# Patient Record
Sex: Female | Born: 1980 | Race: White | Hispanic: No | Marital: Married | Smoking: Never smoker
Health system: Southern US, Community
[De-identification: ages and names within clinical notes are randomized; demographics above are authoritative.]

## PROBLEM LIST (undated history)

## (undated) DIAGNOSIS — R55 Syncope and collapse: Secondary | ICD-10-CM

## (undated) DIAGNOSIS — E559 Vitamin D deficiency, unspecified: Secondary | ICD-10-CM

## (undated) DIAGNOSIS — R002 Palpitations: Secondary | ICD-10-CM

## (undated) DIAGNOSIS — J45909 Unspecified asthma, uncomplicated: Secondary | ICD-10-CM

## (undated) DIAGNOSIS — E7800 Pure hypercholesterolemia, unspecified: Secondary | ICD-10-CM

## (undated) DIAGNOSIS — O139 Gestational [pregnancy-induced] hypertension without significant proteinuria, unspecified trimester: Secondary | ICD-10-CM

## (undated) DIAGNOSIS — R42 Dizziness and giddiness: Secondary | ICD-10-CM

## (undated) HISTORY — DX: Dizziness and giddiness: R42

## (undated) HISTORY — DX: Unspecified asthma, uncomplicated: J45.909

## (undated) HISTORY — DX: Pure hypercholesterolemia, unspecified: E78.00

## (undated) HISTORY — DX: Vitamin D deficiency, unspecified: E55.9

## (undated) HISTORY — DX: Palpitations: R00.2

## (undated) HISTORY — PX: WISDOM TOOTH EXTRACTION: SHX21

## (undated) HISTORY — PX: OTHER SURGICAL HISTORY: SHX169

## (undated) HISTORY — DX: Syncope and collapse: R55

---

## 2000-04-18 ENCOUNTER — Other Ambulatory Visit: Admission: RE | Admit: 2000-04-18 | Discharge: 2000-04-18 | Payer: Self-pay | Admitting: Obstetrics & Gynecology

## 2001-02-20 ENCOUNTER — Encounter: Admission: RE | Admit: 2001-02-20 | Discharge: 2001-02-20 | Payer: Self-pay | Admitting: Occupational Medicine

## 2001-02-20 ENCOUNTER — Encounter: Payer: Self-pay | Admitting: Occupational Medicine

## 2004-04-07 ENCOUNTER — Other Ambulatory Visit: Admission: RE | Admit: 2004-04-07 | Discharge: 2004-04-07 | Payer: Self-pay | Admitting: Obstetrics and Gynecology

## 2004-07-21 ENCOUNTER — Ambulatory Visit (HOSPITAL_COMMUNITY): Admission: RE | Admit: 2004-07-21 | Discharge: 2004-07-21 | Payer: Self-pay | Admitting: Obstetrics & Gynecology

## 2004-12-08 ENCOUNTER — Inpatient Hospital Stay (HOSPITAL_COMMUNITY): Admission: AD | Admit: 2004-12-08 | Discharge: 2004-12-11 | Payer: Self-pay | Admitting: Obstetrics and Gynecology

## 2006-11-16 ENCOUNTER — Inpatient Hospital Stay (HOSPITAL_COMMUNITY): Admission: AD | Admit: 2006-11-16 | Discharge: 2006-11-18 | Payer: Self-pay | Admitting: Obstetrics and Gynecology

## 2009-04-01 ENCOUNTER — Emergency Department: Payer: Self-pay | Admitting: Unknown Physician Specialty

## 2009-04-08 ENCOUNTER — Ambulatory Visit: Payer: Self-pay | Admitting: Orthopedic Surgery

## 2009-04-13 ENCOUNTER — Emergency Department: Payer: Self-pay | Admitting: Unknown Physician Specialty

## 2010-03-31 ENCOUNTER — Inpatient Hospital Stay (HOSPITAL_COMMUNITY): Admission: RE | Admit: 2010-03-31 | Discharge: 2010-04-02 | Payer: Self-pay | Admitting: Obstetrics and Gynecology

## 2010-06-16 ENCOUNTER — Ambulatory Visit: Payer: Self-pay | Admitting: Family Medicine

## 2010-06-16 DIAGNOSIS — IMO0002 Reserved for concepts with insufficient information to code with codable children: Secondary | ICD-10-CM | POA: Insufficient documentation

## 2010-06-16 DIAGNOSIS — I10 Essential (primary) hypertension: Secondary | ICD-10-CM

## 2010-06-28 ENCOUNTER — Telehealth (INDEPENDENT_AMBULATORY_CARE_PROVIDER_SITE_OTHER): Payer: Self-pay | Admitting: *Deleted

## 2010-07-01 ENCOUNTER — Ambulatory Visit: Payer: Self-pay | Admitting: Family Medicine

## 2010-07-01 DIAGNOSIS — Z8639 Personal history of other endocrine, nutritional and metabolic disease: Secondary | ICD-10-CM

## 2010-07-01 DIAGNOSIS — Z862 Personal history of diseases of the blood and blood-forming organs and certain disorders involving the immune mechanism: Secondary | ICD-10-CM | POA: Insufficient documentation

## 2010-07-04 LAB — CONVERTED CEMR LAB: GGT: 31 units/L (ref 7–51)

## 2010-07-05 LAB — CONVERTED CEMR LAB
HCV Ab: NEGATIVE
Hep A IgM: NEGATIVE
Hep B C IgM: NEGATIVE
Hepatitis B Surface Ag: NEGATIVE

## 2010-07-06 ENCOUNTER — Encounter: Admission: RE | Admit: 2010-07-06 | Discharge: 2010-07-06 | Payer: Self-pay | Admitting: Family Medicine

## 2010-07-06 ENCOUNTER — Telehealth: Payer: Self-pay | Admitting: Family Medicine

## 2010-07-06 LAB — CONVERTED CEMR LAB
ALT: 70 units/L — ABNORMAL HIGH (ref 0–35)
AST: 42 units/L — ABNORMAL HIGH (ref 0–37)
Albumin: 4.2 g/dL (ref 3.5–5.2)
Alkaline Phosphatase: 103 units/L (ref 39–117)
Bilirubin, Direct: 0.2 mg/dL (ref 0.0–0.3)
Total Bilirubin: 1.1 mg/dL (ref 0.3–1.2)
Total Protein: 7.5 g/dL (ref 6.0–8.3)

## 2010-07-21 ENCOUNTER — Ambulatory Visit: Payer: Self-pay | Admitting: Family Medicine

## 2010-07-22 LAB — CONVERTED CEMR LAB
ALT: 50 units/L — ABNORMAL HIGH (ref 0–35)
AST: 36 units/L (ref 0–37)
Albumin: 4.5 g/dL (ref 3.5–5.2)
Alkaline Phosphatase: 103 units/L (ref 39–117)
Bilirubin, Direct: 0.2 mg/dL (ref 0.0–0.3)
Total Bilirubin: 1.5 mg/dL — ABNORMAL HIGH (ref 0.3–1.2)
Total Protein: 7.5 g/dL (ref 6.0–8.3)

## 2011-01-01 LAB — CONVERTED CEMR LAB
ALT: 69 units/L — ABNORMAL HIGH (ref 0–35)
AST: 43 units/L — ABNORMAL HIGH (ref 0–37)
Albumin: 4.4 g/dL (ref 3.5–5.2)
Alkaline Phosphatase: 90 units/L (ref 39–117)
BUN: 15 mg/dL (ref 6–23)
Basophils Absolute: 0 10*3/uL (ref 0.0–0.1)
Basophils Relative: 0.3 % (ref 0.0–3.0)
Bilirubin, Direct: 0.1 mg/dL (ref 0.0–0.3)
CO2: 28 meq/L (ref 19–32)
Calcium: 9.6 mg/dL (ref 8.4–10.5)
Chloride: 106 meq/L (ref 96–112)
Cholesterol: 244 mg/dL — ABNORMAL HIGH (ref 0–200)
Creatinine, Ser: 0.6 mg/dL (ref 0.4–1.2)
Direct LDL: 163.7 mg/dL
Eosinophils Absolute: 0.2 10*3/uL (ref 0.0–0.7)
Eosinophils Relative: 2.5 % (ref 0.0–5.0)
GFR calc non Af Amer: 126.26 mL/min (ref >60)
Glucose, Bld: 78 mg/dL (ref 70–99)
HCT: 38.5 % (ref 36.0–46.0)
HDL: 69.1 mg/dL (ref >39.00)
Hemoglobin: 13.2 g/dL (ref 12.0–15.0)
Lymphocytes Relative: 31 % (ref 12.0–46.0)
Lymphs Abs: 2.1 10*3/uL (ref 0.7–4.0)
MCHC: 34.4 g/dL (ref 30.0–36.0)
MCV: 85.7 fL (ref 78.0–100.0)
Monocytes Absolute: 0.4 10*3/uL (ref 0.1–1.0)
Monocytes Relative: 5.6 % (ref 3.0–12.0)
Neutro Abs: 4.1 10*3/uL (ref 1.4–7.7)
Neutrophils Relative %: 60.6 % (ref 43.0–77.0)
Platelets: 283 10*3/uL (ref 150.0–400.0)
Potassium: 4.3 meq/L (ref 3.5–5.1)
RBC: 4.49 M/uL (ref 3.87–5.11)
RDW: 13.8 % (ref 11.5–14.6)
Sodium: 140 meq/L (ref 135–145)
TSH: 0.58 microintl units/mL (ref 0.35–5.50)
Total Bilirubin: 1 mg/dL (ref 0.3–1.2)
Total CHOL/HDL Ratio: 4
Total Protein: 7.8 g/dL (ref 6.0–8.3)
Triglycerides: 93 mg/dL (ref 0.0–149.0)
VLDL: 18.6 mg/dL (ref 0.0–40.0)
WBC: 6.8 10*3/uL (ref 4.5–10.5)

## 2011-01-03 NOTE — Assessment & Plan Note (Signed)
Summary: new to estab/cbs   Vital Signs:  Patient profile:   30 year old female Height:      64 inches Weight:      149 pounds BMI:     25.67 Temp:     98.6 degrees F oral Pulse rate:   64 / minute BP sitting:   130 / 100  (left arm)  Vitals Entered By: Jeremy Johann CMA (June 16, 2010 9:39 AM) CC: NEW TO ESTABLISH, DISCUSS ELEVATED BP   History of Present Illness: Pt is here to establish and to have BP checked.  Pt with hx elevated BP during all 3 pregnancies.   Pt was on labetalol after her son -- she was not d/c with any med after her daughter was born 3 months ago.  Pt with no symptoms--no ha, no cp, no sob.    Preventive Screening-Counseling & Management  Alcohol-Tobacco     Alcohol drinks/day: 0     Smoking Status: never  Caffeine-Diet-Exercise     Caffeine use/day: 2-3     Does Patient Exercise: no     Exercise Counseling: to improve exercise regimen  Current Medications (verified): 1)  Prenatal Vitamins .... Once Daily  Allergies (verified): 1)  ! Pcn  Past History:  Past Medical History: Current Problems:  HYPERTENSION, GESTATIONAL (ICD-642.90)  Past Surgical History: R arm fracture  Family History: Reviewed history and no changes required. DM-MATERNAL GRANDPARENTS HTN-MOTHER,FATHER,BROTHER,GRANDEPARENTS BREAST CA-PATERNAL GM  Social History: Reviewed history and no changes required. Married Never Smoked Alcohol use-no Regular exercise-no Occupation:  [employer] Smoking Status:  never Does Patient Exercise:  no Caffeine use/day:  2-3 Occupation:  employed  Review of Systems      See HPI  Physical Exam  General:  Well-developed,well-nourished,in no acute distress; alert,appropriate and cooperative throughout examination Neck:  No deformities, masses, or tenderness noted. Lungs:  Normal respiratory effort, chest expands symmetrically. Lungs are clear to auscultation, no crackles or wheezes. Heart:  normal rate and no murmur.     Extremities:  No clubbing, cyanosis, edema, or deformity noted with normal full range of motion of all joints.   Psych:  Cognition and judgment appear intact. Alert and cooperative with normal attention span and concentration. No apparent delusions, illusions, hallucinations   Impression & Recommendations:  Problem # 1:  HYPERTENSION, BENIGN ESSENTIAL (ICD-401.1) watch salt in diet diet and exercise d/w pt Her updated medication list for this problem includes:    Labetalol Hcl 100 Mg Tabs (Labetalol hcl) .Marland Kitchen... 1 by mouth two times a day  Orders: EKG w/ Interpretation (93000) Venipuncture (16109) TLB-Lipid Panel (80061-LIPID) TLB-BMP (Basic Metabolic Panel-BMET) (80048-METABOL) TLB-CBC Platelet - w/Differential (85025-CBCD) TLB-Hepatic/Liver Function Pnl (80076-HEPATIC) TLB-TSH (Thyroid Stimulating Hormone) (84443-TSH)  BP today: 130/100  Complete Medication List: 1)  Prenatal Vitamins  .... Once daily 2)  Labetalol Hcl 100 Mg Tabs (Labetalol hcl) .Marland Kitchen.. 1 by mouth two times a day 3)  Bp Monitor  .... As directed  Patient Instructions: 1)  Please schedule a follow-up appointment in 2 weeks.  2)  Check your  Blood Pressure regularly .  Prescriptions: BP MONITOR as directed  #1 x 0   Entered and Authorized by:   Loreen Freud DO   Signed by:   Loreen Freud DO on 06/16/2010   Method used:   Print then Give to Patient   RxID:   [id number] LABETALOL HCL 100 MG TABS (LABETALOL HCL) 1 by mouth two times a day  #60 x 2   Entered  and Authorized by:   Loreen Freud DO   Signed by:   Loreen Freud DO on 06/16/2010   Method used:   Electronically to        [facility] [address] (retail)       [address]       [place], [place]  [address]       Ph: [phone]       Fax: [phone]   RxID:   [id number]    EKG  Procedure date:  06/16/2010  Findings:      Normal sinus rhythm with rate of:  70

## 2011-01-03 NOTE — Assessment & Plan Note (Signed)
Summary: rto 2 weeks/cbs   Vital Signs:  Patient profile:   30 year old female Weight:      146 pounds Pulse rate:   80 / minute BP sitting:   140 / 90  (right arm)  Vitals Entered By: Almeta Monas CMA Duncan Dull) (July 21, 2010 1:01 PM) CC: 2 wk f/u, pt has not taken BP meds today   History of Present Illness:  Hypertension follow-up      This is a 30 year old woman who presents for Hypertension follow-up.  The patient denies lightheadedness, urinary frequency, headaches, edema, impotence, rash, and fatigue.  The patient denies the following associated symptoms: chest pain, chest pressure, exercise intolerance, dyspnea, palpitations, syncope, leg edema, and pedal edema.  Compliance with medications (by patient report) has been near 100%.  The patient reports that dietary compliance has been good.  Adjunctive measures currently used by the patient include salt restriction.    Current Medications (verified): 1)  Prenatal Vitamins .... Once Daily 2)  Labetalol Hcl 200 Mg Tabs (Labetalol Hcl) .Marland Kitchen.. 1 By Mouth Two Times A Day 3)  Bp Monitor .... As Directed  Allergies (verified): 1)  ! Pcn  Past History:  Past medical, surgical, family and social histories (including risk factors) reviewed for relevance to current acute and chronic problems.  Past Medical History: Reviewed history from 06/16/2010 and no changes required. Current Problems:  HYPERTENSION, GESTATIONAL (ICD-642.90)  Past Surgical History: Reviewed history from 06/16/2010 and no changes required. R arm fracture  Family History: Reviewed history from 06/16/2010 and no changes required. DM-MATERNAL GRANDPARENTS HTN-MOTHER,FATHER,BROTHER,GRANDEPARENTS BREAST CA-PATERNAL GM  Social History: Reviewed history from 06/16/2010 and no changes required. Married Never Smoked Alcohol use-no Regular exercise-no Occupation:  [employer]  Review of Systems      See HPI  Physical Exam  General:   Well-developed,well-nourished,in no acute distress; alert,appropriate and cooperative throughout examination Neck:  No deformities, masses, or tenderness noted. Lungs:  Normal respiratory effort, chest expands symmetrically. Lungs are clear to auscultation, no crackles or wheezes. Heart:  normal rate and no murmur.   Extremities:  No clubbing, cyanosis, edema, or deformity noted with normal full range of motion of all joints.   Psych:  Oriented X3 and normally interactive.     Impression & Recommendations:  Problem # 1:  HYPERTENSION, BENIGN ESSENTIAL (ICD-401.1)  Her updated medication list for this problem includes:    Labetalol Hcl 200 Mg Tabs (Labetalol hcl) .Marland Kitchen... 1 by mouth two times a day  BP today: 140/90 Prior BP: 130/106 (07/01/2010)  Labs Reviewed: K+: 4.3 (06/16/2010) Creat: : 0.6 (06/16/2010)   Chol: 244 (06/16/2010)   HDL: 69.10 (06/16/2010)   TG: 93.0 (06/16/2010)  Problem # 2:  LIVER FUNCTION TESTS, ABNORMAL, HX OF (ICD-V12.2) ? if from labetalol Orders: Venipuncture (16109) TLB-Hepatic/Liver Function Pnl (80076-HEPATIC) Specimen Handling (60454) TLB-Hepatic/Liver Function Pnl (80076-HEPATIC)  Complete Medication List: 1)  Prenatal Vitamins  .... Once daily 2)  Labetalol Hcl 200 Mg Tabs (Labetalol hcl) .Marland Kitchen.. 1 by mouth two times a day 3)  Bp Monitor  .... As directed  Patient Instructions: 1)  Please schedule a follow-up appointment in 3 months .

## 2011-01-03 NOTE — Assessment & Plan Note (Signed)
Summary: 2 week followup//kn   Vital Signs:  Patient profile:   30 year old female Height:      64 inches Weight:      148 pounds Temp:     98.5 degrees F oral Pulse rate:   86 / minute BP sitting:   130 / 106  (left arm)  Vitals Entered By: Jeremy Johann CMA (July 01, 2010 9:37 AM) CC: 2 WEEK F/U BP CHECK   History of Present Illness:  Hypertension follow-up      This is a 30 year old woman who presents for Hypertension follow-up.  The patient denies lightheadedness, urinary frequency, headaches, edema, impotence, rash, and fatigue.  The patient denies the following associated symptoms: chest pain, chest pressure, exercise intolerance, dyspnea, palpitations, syncope, leg edema, and pedal edema.  Compliance with medications (by patient report) has been near 100%.  The patient reports that dietary compliance has been good.  The patient reports no exercise.  Adjunctive measures currently used by the patient include salt restriction.    Pt also needs to reveiw labs.  Pt is not drinking ETOH or taking and otc meds.    Current Medications (verified): 1)  Prenatal Vitamins .... Once Daily 2)  Labetalol Hcl 200 Mg Tabs (Labetalol Hcl) .Marland Kitchen.. 1 By Mouth Two Times A Day 3)  Bp Monitor .... As Directed  Allergies (verified): 1)  ! Pcn  Past History:  Past medical, surgical, family and social histories (including risk factors) reviewed for relevance to current acute and chronic problems.  Past Medical History: Reviewed history from 06/16/2010 and no changes required. Current Problems:  HYPERTENSION, GESTATIONAL (ICD-642.90)  Past Surgical History: Reviewed history from 06/16/2010 and no changes required. R arm fracture  Family History: Reviewed history from 06/16/2010 and no changes required. DM-MATERNAL GRANDPARENTS HTN-MOTHER,FATHER,BROTHER,GRANDEPARENTS BREAST CA-PATERNAL GM  Social History: Reviewed history from 06/16/2010 and no changes required. Married Never  Smoked Alcohol use-no Regular exercise-no Occupation:  [employer]  Review of Systems      See HPI  Physical Exam  General:  Well-developed,well-nourished,in no acute distress; alert,appropriate and cooperative throughout examination Neck:  No deformities, masses, or tenderness noted. Lungs:  Normal respiratory effort, chest expands symmetrically. Lungs are clear to auscultation, no crackles or wheezes. Heart:  normal rate and no murmur.   Extremities:  No clubbing, cyanosis, edema, or deformity noted with normal full range of motion of all joints.   Psych:  Cognition and judgment appear intact. Alert and cooperative with normal attention span and concentration. No apparent delusions, illusions, hallucinations   Impression & Recommendations:  Problem # 1:  HYPERTENSION, BENIGN ESSENTIAL (ICD-401.1)  Her updated medication list for this problem includes:    Labetalol Hcl 200 Mg Tabs (Labetalol hcl) .Marland Kitchen... 1 by mouth two times a day  BP today: 130/106 Prior BP: 130/100 (06/16/2010)  Labs Reviewed: K+: 4.3 (06/16/2010) Creat: : 0.6 (06/16/2010)   Chol: 244 (06/16/2010)   HDL: 69.10 (06/16/2010)   TG: 93.0 (06/16/2010)  Problem # 2:  LIVER FUNCTION TESTS, ABNORMAL, HX OF (ICD-V12.2)  Orders: Venipuncture (16109) TLB-Hepatic/Liver Function Pnl (80076-HEPATIC) T-Hepatitis Acute Panel (60454-09811) T- * Misc. Laboratory test [id number]) Specimen Handling (29562) TLB-GGT (Gamma GT) (82977-GGT)  Complete Medication List: 1)  Prenatal Vitamins  .... Once daily 2)  Labetalol Hcl 200 Mg Tabs (Labetalol hcl) .Marland Kitchen.. 1 by mouth two times a day 3)  Bp Monitor  .... As directed  Patient Instructions: 1)  Please schedule a follow-up appointment in 2 weeks.  Prescriptions:  LABETALOL HCL 200 MG TABS (LABETALOL HCL) 1 by mouth two times a day  #60 x 2   Entered and Authorized by:   Loreen Freud DO   Signed by:   Loreen Freud DO on 07/01/2010   Method used:   Print then Give to  Patient   RxID:   [id number]   Appended Document: 2 week followup//kn  Laboratory Results   Urine Tests   Date/Time Reported: July 01, 2010 10:22 AM   Routine Urinalysis   Color: yellow Appearance: Clear Glucose: negative   (Normal Range: Negative) Bilirubin: negative   (Normal Range: Negative) Ketone: negative   (Normal Range: Negative) Spec. Gravity: >=1.030   (Normal Range: 1.003-1.035) Blood: negative   (Normal Range: Negative) pH: 5.0   (Normal Range: 5.0-8.0) Protein: 30   (Normal Range: Negative) Urobilinogen: negative   (Normal Range: 0-1) Nitrite: negative   (Normal Range: Negative) Leukocyte Esterace: negative   (Normal Range: Negative)    Comments: Floydene Flock  July 01, 2010 10:22 AM

## 2011-01-03 NOTE — Progress Notes (Signed)
Summary: LAB RESULTS  Phone Note Outgoing Call   Call placed by: Jeremy Johann CMA,  June 28, 2010 9:02 AM Details for Reason: Ideally your LDL (bad cholesterol) should be <_100___, your HDL (good cholesterol) should be >__50_ and your triglycerides should be< 150.  Diet and exercise will increase HDL and decrease the LDL and triglycerides. Read Dr. Danice Goltz book--Eat Drink and Be Healthy.  Recheck 3 months--272.4  hep, lipid  Liver enzymes are elevate.  Any  alcohol, tylenol, othe othc meds. ?  Stop all and recheck 2 weekss---790.4  hep, ggt. Summary of Call: left message to call office....................Marland KitchenFelecia Deloach CMA  June 28, 2010 9:02 AM   DISCUSS WITH PATIENT, pt coming in tomorrow.......Marland KitchenFelecia Deloach CMA  June 30, 2010 11:32 AM

## 2011-01-03 NOTE — Progress Notes (Signed)
Summary: lab results  Phone Note Outgoing Call   Call placed by: [facility] CMA,  July 06, 2010 2:57 PM Details for Reason: ABDOMINAL ULTRASOUND-no acute findings only renal cyst -- Summary of Call: Hepatic/Liver Function Panel same---check [place] abd    left message to call office......................Marland KitchenFelecia Deloach CMA  July 06, 2010 2:56 PM   Follow-up for Phone Call        Patient notified.  Follow-up by: Lucious Groves CMA,  July 06, 2010 3:00 PM

## 2011-02-21 LAB — CBC
HCT: 30.8 % — ABNORMAL LOW (ref 36.0–46.0)
Hemoglobin: 13.3 g/dL (ref 12.0–15.0)
MCHC: 34.1 g/dL (ref 30.0–36.0)
MCV: 87.6 fL (ref 78.0–100.0)
RBC: 3.52 MIL/uL — ABNORMAL LOW (ref 3.87–5.11)
RBC: 4.43 MIL/uL (ref 3.87–5.11)
WBC: 10.1 10*3/uL (ref 4.0–10.5)

## 2011-02-21 LAB — COMPREHENSIVE METABOLIC PANEL
ALT: 11 U/L (ref 0–35)
AST: 21 U/L (ref 0–37)
Alkaline Phosphatase: 162 U/L — ABNORMAL HIGH (ref 39–117)
CO2: 21 mEq/L (ref 19–32)
GFR calc Af Amer: >60 mL/min (ref >60)
GFR calc non Af Amer: >60 mL/min (ref >60)
Glucose, Bld: 76 mg/dL (ref 70–99)
Potassium: 4.1 mEq/L (ref 3.5–5.1)
Sodium: 136 mEq/L (ref 135–145)
Total Protein: 6.1 g/dL (ref 6.0–8.3)

## 2011-02-21 LAB — LACTATE DEHYDROGENASE: LDH: 127 U/L (ref 94–250)

## 2011-05-01 ENCOUNTER — Emergency Department (HOSPITAL_COMMUNITY): Payer: Worker's Compensation

## 2011-05-01 ENCOUNTER — Emergency Department (HOSPITAL_COMMUNITY)
Admission: EM | Admit: 2011-05-01 | Discharge: 2011-05-01 | Disposition: A | Discharge status: Home / Self Care | Payer: Worker's Compensation | Attending: Emergency Medicine | Admitting: Emergency Medicine

## 2011-05-01 DIAGNOSIS — X500XXA Overexertion from strenuous movement or load, initial encounter: Secondary | ICD-10-CM | POA: Insufficient documentation

## 2011-05-01 DIAGNOSIS — M7730 Calcaneal spur, unspecified foot: Secondary | ICD-10-CM | POA: Insufficient documentation

## 2011-05-01 DIAGNOSIS — S93409A Sprain of unspecified ligament of unspecified ankle, initial encounter: Secondary | ICD-10-CM | POA: Insufficient documentation

## 2013-02-03 ENCOUNTER — Emergency Department (HOSPITAL_COMMUNITY)
Admission: EM | Admit: 2013-02-03 | Discharge: 2013-02-03 | Disposition: A | Discharge status: Home / Self Care | Payer: 59 | Attending: Emergency Medicine | Admitting: Emergency Medicine

## 2013-02-03 DIAGNOSIS — S61239A Puncture wound without foreign body of unspecified finger without damage to nail, initial encounter: Secondary | ICD-10-CM

## 2013-02-03 DIAGNOSIS — S61209A Unspecified open wound of unspecified finger without damage to nail, initial encounter: Secondary | ICD-10-CM | POA: Insufficient documentation

## 2013-02-03 DIAGNOSIS — W268XXA Contact with other sharp object(s), not elsewhere classified, initial encounter: Secondary | ICD-10-CM | POA: Insufficient documentation

## 2013-02-03 DIAGNOSIS — Y9389 Activity, other specified: Secondary | ICD-10-CM | POA: Insufficient documentation

## 2013-02-03 DIAGNOSIS — Y929 Unspecified place or not applicable: Secondary | ICD-10-CM | POA: Insufficient documentation

## 2013-02-03 NOTE — ED Notes (Signed)
Pt states she cut her R index finger while trying to open a package of meat. Pt has lac to tip of R index finger. Bleeding controlled. Pt states she is up to date on her Tetanus shot.

## 2013-02-03 NOTE — ED Provider Notes (Signed)
Medical screening examination/treatment/procedure(s) were performed by non-physician practitioner and as supervising physician I was immediately available for consultation/collaboration.   [name], MD 02/03/13 2051

## 2013-02-03 NOTE — ED Provider Notes (Signed)
History    This chart was scribed for non-physician practitioner working with [name], MD by Charolett Bumpers, ED Scribe. This patient was seen in room WTR5/WTR5 and the patient's care was started at 1948.   CSN: [id number]  Arrival date & time 02/03/13  1912   First MD Initiated Contact with Patient 02/03/13 1948      Chief Complaint  Patient presents with  . Extremity Laceration    The history is provided by the patient. No language interpreter was used.   Mary Yang is a 32 y.o. female who presents to the Emergency Department complaining of laceration to her right index finger that occurred while opening a package of meat PTA. The laceration is located on the right index finger tip. She states that she bled heavily for about 20 minutes and tried putting flour on it to stop the bleeding. Bleeding is controlled here in ED. She reports mild associated pain and has not taken anything for pain. She states that her Tetanus was updated a year ago.    No past medical history on file.  No past surgical history on file.  No family history on file.  History  Substance Use Topics  . Smoking status: Not on file  . Smokeless tobacco: Not on file  . Alcohol Use: Not on file    OB History   No data available      Review of Systems A complete 10 system review of systems was obtained and all systems are negative except as noted in the HPI and PMH.   Allergies  Penicillins  Home Medications  No current outpatient prescriptions on file.  BP 140/102  Pulse 94  Temp(Src) 98.2 F (36.8 C) (Oral)  SpO2 100%  Physical Exam  Nursing note and vitals reviewed. Constitutional: She is oriented to person, place, and time. She appears well-developed and well-nourished. No distress.  HENT:  Head: Normocephalic and atraumatic.  Eyes: EOM are normal.  Neck: Neck supple. No tracheal deviation present.  Cardiovascular: Normal rate.   Pulmonary/Chest: Effort normal. No  respiratory distress.  Musculoskeletal: Normal range of motion.  Neurological: She is alert and oriented to person, place, and time.  Skin: Skin is warm and dry.  Psychiatric: She has a normal mood and affect. Her behavior is normal.    ED Course  Procedures (including critical care time)  DIAGNOSTIC STUDIES: Oxygen Saturation is 100% on room air, normal by my interpretation.    COORDINATION OF CARE:  20:05-Discussed planned course of treatment with the patient including cleaning the wound to determine what repair is needed, who is agreeable at this time.    Labs Reviewed - No data to display No results found.   No diagnosis found.   1. Puncture wound, right index finger  MDM  Small, nonsuturable puncture wound to distal right index finger. Steri-strips applied to control bleeding.     I personally performed the services described in this documentation, which was scribed in my presence. The recorded information has been reviewed and is accurate.       Arnoldo Hooker, PA-C 02/03/13 2036

## 2014-03-16 ENCOUNTER — Encounter (HOSPITAL_COMMUNITY): Payer: Self-pay | Admitting: Emergency Medicine

## 2014-03-16 ENCOUNTER — Emergency Department (HOSPITAL_COMMUNITY): Payer: 59

## 2014-03-16 ENCOUNTER — Emergency Department (HOSPITAL_COMMUNITY)
Admission: EM | Admit: 2014-03-16 | Discharge: 2014-03-16 | Disposition: A | Discharge status: Home / Self Care | Payer: 59 | Attending: Emergency Medicine | Admitting: Emergency Medicine

## 2014-03-16 DIAGNOSIS — R079 Chest pain, unspecified: Secondary | ICD-10-CM

## 2014-03-16 DIAGNOSIS — R0789 Other chest pain: Secondary | ICD-10-CM | POA: Insufficient documentation

## 2014-03-16 DIAGNOSIS — R0602 Shortness of breath: Secondary | ICD-10-CM | POA: Insufficient documentation

## 2014-03-16 DIAGNOSIS — R000 Tachycardia, unspecified: Secondary | ICD-10-CM | POA: Insufficient documentation

## 2014-03-16 DIAGNOSIS — Z880 Allergy status to penicillin: Secondary | ICD-10-CM | POA: Insufficient documentation

## 2014-03-16 HISTORY — DX: Gestational (pregnancy-induced) hypertension without significant proteinuria, unspecified trimester: O13.9

## 2014-03-16 LAB — BASIC METABOLIC PANEL
BUN: 11 mg/dL (ref 6–23)
CHLORIDE: 100 meq/L (ref 96–112)
CO2: 23 mEq/L (ref 19–32)
Calcium: 9.4 mg/dL (ref 8.4–10.5)
Creatinine, Ser: 0.73 mg/dL (ref 0.50–1.10)
GFR calc non Af Amer: >90 mL/min (ref >90)
Glucose, Bld: 143 mg/dL — ABNORMAL HIGH (ref 70–99)
POTASSIUM: 4.5 meq/L (ref 3.7–5.3)
Sodium: 139 mEq/L (ref 137–147)

## 2014-03-16 LAB — CBC
HEMATOCRIT: 41.2 % (ref 36.0–46.0)
HEMOGLOBIN: 14.1 g/dL (ref 12.0–15.0)
MCH: 29.8 pg (ref 26.0–34.0)
MCHC: 34.2 g/dL (ref 30.0–36.0)
MCV: 87.1 fL (ref 78.0–100.0)
Platelets: 274 10*3/uL (ref 150–400)
RBC: 4.73 MIL/uL (ref 3.87–5.11)
RDW: 12.8 % (ref 11.5–15.5)
WBC: 10.8 10*3/uL — AB (ref 4.0–10.5)

## 2014-03-16 LAB — I-STAT TROPONIN, ED: TROPONIN I, POC: 0 ng/mL (ref 0.00–0.08)

## 2014-03-16 LAB — PRO B NATRIURETIC PEPTIDE: Pro B Natriuretic peptide (BNP): 10.7 pg/mL (ref 0–125)

## 2014-03-16 MED ORDER — IOHEXOL 350 MG/ML SOLN
80.0000 mL | Freq: Once | INTRAVENOUS | Status: AC | PRN
  Administered 2014-03-16: 60 mL via INTRAVENOUS

## 2014-03-16 MED ORDER — IBUPROFEN 600 MG PO TABS: 600.0000 mg | ORAL_TABLET | Freq: Four times a day (QID) | ORAL | Status: DC | PRN

## 2014-03-16 MED ORDER — SODIUM CHLORIDE 0.9 % IV BOLUS (SEPSIS)
1000.0000 mL | Freq: Once | INTRAVENOUS | Status: AC
  Administered 2014-03-16: 1000 mL via INTRAVENOUS

## 2014-03-16 NOTE — ED Notes (Signed)
Dr. Docherty at [facility] 

## 2014-03-16 NOTE — ED Notes (Addendum)
Pt in c/o left sided chest pain that started today around 11am, pain left and then returned, radiates to left shoulder and left arm, also with numbness and tingling that has worsened in the last 30 min, pt states she doesn't feel right, intermittent diaphoresis, fatigue, dizziness, states her hands are swollen- pt reports possible weight gain over last few days, states home scale 3-4 days ago and scale today are approx 10 lbs different.

## 2014-03-16 NOTE — Discharge Instructions (Signed)
Chest Pain (Nonspecific) °It is often hard to give a specific diagnosis for the cause of chest pain. There is always a chance that your pain could be related to something serious, such as a heart attack or a blood clot in the lungs. You need to follow up with your caregiver for further evaluation. °CAUSES  °· Heartburn. °· Pneumonia or bronchitis. °· Anxiety or stress. °· Inflammation around your heart (pericarditis) or lung (pleuritis or pleurisy). °· A blood clot in the lung. °· A collapsed lung (pneumothorax). It can develop suddenly on its own (spontaneous pneumothorax) or from injury (trauma) to the chest. °· Shingles infection (herpes zoster virus). °The chest wall is composed of bones, muscles, and cartilage. Any of these can be the source of the pain. °· The bones can be bruised by injury. °· The muscles or cartilage can be strained by coughing or overwork. °· The cartilage can be affected by inflammation and become sore (costochondritis). °DIAGNOSIS  °Lab tests or other studies, such as X-rays, electrocardiography, stress testing, or cardiac imaging, may be needed to find the cause of your pain.  °TREATMENT  °· Treatment depends on what may be causing your chest pain. Treatment may include: °· Acid blockers for heartburn. °· Anti-inflammatory medicine. °· Pain medicine for inflammatory conditions. °· Antibiotics if an infection is present. °· You may be advised to change lifestyle habits. This includes stopping smoking and avoiding alcohol, caffeine, and chocolate. °· You may be advised to keep your head raised (elevated) when sleeping. This reduces the chance of acid going backward from your stomach into your esophagus. °· Most of the time, nonspecific chest pain will improve within 2 to 3 days with rest and mild pain medicine. °HOME CARE INSTRUCTIONS  °· If antibiotics were prescribed, take your antibiotics as directed. Finish them even if you start to feel better. °· For the next few days, avoid physical  activities that bring on chest pain. Continue physical activities as directed. °· Do not smoke. °· Avoid drinking alcohol. °· Only take over-the-counter or prescription medicine for pain, discomfort, or fever as directed by your caregiver. °· Follow your caregiver's suggestions for further testing if your chest pain does not go away. °· Keep any follow-up appointments you made. If you do not go to an appointment, you could develop lasting (chronic) problems with pain. If there is any problem keeping an appointment, you must call to reschedule. °SEEK MEDICAL CARE IF:  °· You think you are having problems from the medicine you are taking. Read your medicine instructions carefully. °· Your chest pain does not go away, even after treatment. °· You develop a rash with blisters on your chest. °SEEK IMMEDIATE MEDICAL CARE IF:  °· You have increased chest pain or pain that spreads to your arm, neck, jaw, back, or abdomen. °· You develop shortness of breath, an increasing cough, or you are coughing up blood. °· You have severe back or abdominal pain, feel nauseous, or vomit. °· You develop severe weakness, fainting, or chills. °· You have a fever. °THIS IS AN EMERGENCY. Do not wait to see if the pain will go away. Get medical help at once. Call your local emergency services (911 in [place] Do not drive yourself to the hospital. °MAKE SURE YOU:  °· Understand these instructions. °· Will watch your condition. °· Will get help right away if you are not doing well or get worse. °Document Released: 08/30/2005 Document Revised: 02/12/2012 Document Reviewed: 06/25/2008 °ExitCare® Patient Information ©2014 [employer],   LLC. ° ° ° °Emergency Department Resource Guide °1) Find a Doctor and Pay Out of Pocket °Although you won't have to find out who is covered by your insurance plan, it is a good idea to ask around and get recommendations. You will then need to call the office and see if the doctor you have chosen will accept you as a new  patient and what types of options they offer for patients who are self-pay. Some doctors offer discounts or will set up payment plans for their patients who do not have insurance, but you will need to ask so you aren't surprised when you get to your appointment. ° °2) Contact Your Local Health Department °Not all health departments have doctors that can see patients for sick visits, but many do, so it is worth a call to see if yours does. If you don't know where your local health department is, you can check in your phone book. The CDC also has a tool to help you locate your state's health department, and many state websites also have listings of all of their local health departments. ° °3) Find a Walk-in Clinic °If your illness is not likely to be very severe or complicated, you may want to try a walk in clinic. These are popping up all over the country in pharmacies, drugstores, and shopping centers. They're usually staffed by nurse practitioners or physician assistants that have been trained to treat common illnesses and complaints. They're usually fairly quick and inexpensive. However, if you have serious medical issues or chronic medical problems, these are probably not your best option. ° °No Primary Care Doctor: °- Call [facility] at  832-8000 - they can help you locate a primary care doctor that  accepts your insurance, provides certain services, etc. °- Physician Referral Service- 1-800-533-3463 ° °Chronic Pain Problems: °Organization         Address  Phone   Notes  °[facility]  (336) 297-2271 Patients need to be referred by their primary care doctor.  ° °Medication Assistance: °Organization         Address  Phone   Notes  °[place] [facility] [address] °[place], [place] [address] (336) 641-8030 --Must be a resident of [place] °-- Must have NO insurance coverage whatsoever (no Medicaid/ Medicare, etc.) °-- The pt. MUST have a primary  care doctor that directs their care regularly and follows them in the community °  °[facility]  (866) 331-1348   °[employer]  (888) 892-1162   ° °Agencies that provide inexpensive medical care: °Organization         Address  Phone   Notes  °[facility]  (336) 832-8035   °[facility]    (336) 832-7272   °[facility] [address] °[place], [place] [address] (336) 832-4777   °[facility] [address], °[place] (336) 271-4999   °[facility]    (336) 373-0678   °[facility]    (336) 272-1050   °[facility] ° [address], [place] Phone:  (336) 832-4444, Fax:  (336) 832-4440 Hours of Operation:  9 am - 6 pm, M-F.  Also accepts Medicaid/Medicare and self-pay.  °[facility] ° [address], [place] Phone: (336) 832-3150, Fax: (336) [phone]. Hours of Operation:  8:30 am - 5:30 pm, M-F.  Also accepts Medicaid and self-pay.  °  [facility] [place] [address], [place] Phone: (336) 878-6027   °[facility] [address], [place], [place] (336)723-1848, Ext. 123 Mondays & Thursdays: 7-9 AM.  First 15 patients are seen on a first come, first serve basis. °  ° °Medicaid-accepting [place] Providers: ° °Organization         Address  Phone   Notes  °[facility] [address], [place] (336) 641-2100 Also accepts self-pay patients.  °[facility] [address], [place] ° (336) 856-9996   °[facility] [address], [place] (336) 288-8857   °[facility] [address] [place] [address], [place] (336) 299-7000   °Veita Bland [address], [place]  ° (336) 373-1557 Only accepts [place] Access Medicaid patients after they have their name applied to their card.  ° °Self-Pay (no insurance) in [place]: ° °Organization         Address  Phone   Notes  °Sickle Cell Patients, [facility] [address], [place] (336) 832-1970   °[facility] [address], [place] (336) 832-4400   °[facility] ° [address], [place] (336) 992-4800   °[facility]/Dr. Osei-Bonsu ° [address] [place] [address], [place] or [address], [place] (336) 841-8500 Phone number for both [place] and [place] locations is the same.  °[facility] [address], [place] (336) 299-0000   °[facility] [address] [place] [address], [place] or [address] (336) 852-7530 °(336) 878-2260   °[facility] [address], [place] (336) 350-1642, phone; (336) 294-5005, fax Sees patients 1st and 3rd Saturday of every month.  Must not qualify for public or private insurance (i.e. Medicaid, Medicare, [place] Health Choice, Veterans' Benefits) • Household income should be no more than 200% of the poverty level •The clinic cannot treat you if you are pregnant or think you are pregnant • Sexually transmitted diseases are not treated at the clinic.  ° ° °Dental Care: °Organization         Address  Phone  Notes  °[facility] [address], [place] (336) 641-6152 Accepts children up to age 21 who are enrolled in Medicaid or [place] Health Choice; pregnant women with a Medicaid card; and children who have applied for Medicaid or [place] Health Choice, but were declined, whose parents can pay a reduced fee at time of service.  °[facility] [place]  [address], [place] (336) 641-7733 Accepts children up to age 21 who are enrolled in Medicaid or  Health Choice; pregnant women with a Medicaid card; and children who have applied for Medicaid or  Health Choice, but were declined, whose parents can  pay a reduced fee at time of service.  °[facility] ° [address],  (336) 641-4533 Patients are seen by appointment only. Walk-ins are not accepted. [facility] will see patients 18 years of age and older. °Monday - Tuesday (8am-5pm) °Most Wednesdays (8:30-5pm) °$30 per visit, cash only  °[facility] ° [address], [place] (336) 641-4533 Patients are seen by appointment only. Walk-ins are not accepted. [facility] will see patients 18 years of   age and older. °One Wednesday Evening (Monthly: Volunteer Based).  $30 per visit, cash only  °[facility]  (919) 537-3737 for adults; Children under age 4, call Graduate Pediatric Dentistry at ([phone]) [phone]. Children aged 4-14, please call ([phone]) [phone] to request a pediatric application. ° Dental services are provided in all areas of dental care including fillings, crowns and bridges, complete and partial dentures, implants, gum treatment, root canals, and extractions. Preventive care is also provided. Treatment is provided to both adults and children. °Patients are selected via a lottery and there is often a waiting list. °  °[facility] [address], °[place] ° (336) 763-8833 [email or web address] °  °[facility] [address], [place], [place] (336)723-1848, Ext. 123 Second and Fourth Thursday of each month, opens at 6:30 AM; Clinic ends at 9 AM.  Patients are seen on a first-come first-served basis, and a limited number are seen during each clinic.  ° °[facility] ° [address], [place], [place] (336) 723-7904   Eligibility Requirements °You must have lived in [place], [place], or [place] counties for at least the last three months. °  You cannot be eligible for state or federal sponsored healthcare insurance, including Veterans Administration, Medicaid, or Medicare. °  You generally cannot be eligible for healthcare  insurance through your employer.  °  How to apply: °Eligibility screenings are held every Tuesday and Wednesday afternoon from 1:00 pm until 4:00 pm. You do not need an appointment for the interview!  °[facility] [address], [place], [place] 336-631-2330   °[facility]  336-342-8273   °[facility]  336-703-3100   °[facility]  336-570-6415   ° °Behavioral Health Resources in the Community: °Intensive Outpatient Programs °Organization         Address  Phone  Notes  °[facility] [address], [place], [place] 336-878-6098   °[facility] [address], [place], [place] 336-832-9800   °[facility] [address], [place], [place] ° 336-882-2125   °[facility] [address],  °[place], [place] 1-800-853-5163 or 336-641-4981   °Substance Abuse Resources °Organization         Address  Phone  Notes  °[facility]  336-882-2125   °[facility]  336-784-9470   °[facility]  336-285-9073   °[facility]  336-845-3988   °Residential & Outpatient Substance Abuse Program  1-800-659-3381   °Psychological Services °Organization         Address  Phone  Notes  °[facility]  336- 832-9600   °[facility]  336- 378-7881   °[facility] [address], [place] 1-800-853-5163 or 336-641-4981   ° °Mobile Crisis Teams °Organization         Address  Phone  Notes  °[facility], Mobile Crisis Care Unit  1-877-626-1772   °Assertive °[facility] ° [address] [place], [place] 336-834-9664   °Sharon DeEsch [address] °[place] 336-554-5454   ° °Self-Help/Support Groups °Organization         Address  Phone             Notes  °[facility]  - variety of support groups  336- 373-1402 Call for more information  °Narcotics Anonymous (NA),  [facility] [address], °[place]   2   meetings at this location  ° °Residential Treatment Programs °Organization         Address  Phone  Notes  °[facility] [address],    °[place]  1-866-801-8205   °[facility] ° [address], [place], [place] 704-293-8524   °[facility] [address], [place] 336-845-3988 Admissions: 8am-3pm M-F  °[facility] [address]    °[place], [place] 336-841-1104   °[facility] [address], [place], [place] 336-379-7146   °[facility] [address]  °[place], [place] 336-285-9073   °[facility] Intensive Outpatient [address], [place], [place] 336-852-3033   °[facility] [address]  °[place], [place] 1-877-615-2722 or 336-784-9470   °[facility] [address] , [place] 336-227-7417 Accepts Medicaid  °[facility] [address]  °[place] 1-800-659-3381 Substance Abuse/Addiction Treatment  ° °[place] Behavioral Health Resources °Organization         Address  Phone  Notes  °[facility]  (888) 581-9988   °Julie Brannon, PhD [address] [place], [place]   (336) 349-5553 or (336) 951-0000   °[facility]   [address] °[place], [place] (336) 349-4454   °[facility] [address], [place], [place] (336) 342-8316 Insurance/Medicaid/sponsorship through [facility]  °[facility] [address]                                    [place], [place] (336) 342-8316 Therapy/tele-psych/case  °[facility] [address]  ° [place], [place] (336) 349-2233    °Dr. Arfeen  (336) 349-4544   °[facility] [place]  [employer] [place] [facility] 1) [address], [place] °2) [address], [place] °3)  [address], [place] (336) 349-3220 °(336) 342-7768 ° °(336) 342-8140    °[place] [facility] (336) 342-1394 or (336) 342-3537 (After Hours)    ° ° ° °

## 2014-03-16 NOTE — ED Notes (Signed)
(  Denies: pain, sob, nausea. Admits to mild dizziness and "hot & sweaty"). Skin W&D. Pt alert, NAD, calm, interactive, no dyspnea. Pt to CT

## 2014-03-16 NOTE — ED Provider Notes (Signed)
CSN: [id number]     Arrival date & time 03/16/14  1639 History   First MD Initiated Contact with Patient 03/16/14 1832     Chief Complaint  Patient presents with  . Chest Pain     (Consider location/radiation/quality/duration/timing/severity/associated sxs/prior Treatment) Patient is a 33 y.o. female presenting with chest pain. The history is provided by the patient. No language interpreter was used.  Chest Pain Pain location:  L chest Pain quality: sharp   Pain radiates to:  L shoulder Pain radiates to the back: no   Pain severity:  Moderate Onset quality:  Sudden Duration:  8 hours (8) Timing:  Intermittent Progression:  Waxing and waning Chronicity:  Recurrent (One episodes easter day lasting several hours) Context: at rest   Relieved by:  Nothing Worsened by:  Nothing tried Ineffective treatments:  None tried Associated symptoms: shortness of breath   Associated symptoms: no abdominal pain, no back pain, no cough, no diaphoresis, no fatigue, no fever, no headache, no lower extremity edema, no nausea, no numbness, no palpitations, no syncope, not vomiting and no weakness   Associated symptoms comment:  One episode with diaphoresis, lighteadedness Shortness of breath:    Severity:  Moderate   Duration:  8 hours   Timing:  Intermittent   Progression:  Waxing and waning Risk factors: obesity   Risk factors: no aortic disease, no birth control, no coronary artery disease, no diabetes mellitus, no Ehlers-Danlos syndrome, no high cholesterol, no hypertension, no immobilization, not female, no Marfan's syndrome, not pregnant, no prior DVT/PE, no smoking and no surgery   Risk factors comment:  FH CAD   Past Medical History  Diagnosis Date  . Gestational hypertension    History reviewed. No pertinent past surgical history. History reviewed. No pertinent family history. History  Substance Use Topics  . Smoking status: Never Smoker   . Smokeless tobacco: Not on file  . Alcohol  Use: Not on file   OB History   Grav Para Term Preterm Abortions TAB SAB Ect Mult Living                 Review of Systems  Constitutional: Negative for fever, chills, diaphoresis, activity change, appetite change and fatigue.  HENT: Negative for congestion, facial swelling, rhinorrhea and sore throat.   Eyes: Negative for photophobia and discharge.  Respiratory: Positive for shortness of breath. Negative for cough and chest tightness.   Cardiovascular: Positive for chest pain. Negative for palpitations, leg swelling and syncope.  Gastrointestinal: Negative for nausea, vomiting, abdominal pain and diarrhea.  Endocrine: Negative for polydipsia and polyuria.  Genitourinary: Negative for dysuria, frequency, difficulty urinating and pelvic pain.  Musculoskeletal: Negative for arthralgias, back pain, neck pain and neck stiffness.  Skin: Negative for color change and wound.  Allergic/Immunologic: Negative for immunocompromised state.  Neurological: Negative for facial asymmetry, weakness, numbness and headaches.  Hematological: Does not bruise/bleed easily.  Psychiatric/Behavioral: Negative for confusion and agitation.      Allergies  Penicillins  Home Medications   Current Outpatient Rx  Name  Route  Sig  Dispense  Refill  . ibuprofen (ADVIL,MOTRIN) 600 MG tablet   Oral   Take 1 tablet (600 mg total) by mouth every 6 (six) hours as needed.   30 tablet   0    BP 149/100  Pulse 90  Temp(Src) 98.6 F (37 C) (Oral)  Resp 16  Wt 175 lb 12.8 oz (79.742 kg)  SpO2 98%  LMP 03/01/2014 Physical Exam  Constitutional: She  is oriented to person, place, and time. She appears well-developed and well-nourished. No distress.  HENT:  Head: Normocephalic and atraumatic.  Mouth/Throat: No oropharyngeal exudate.  Eyes: Pupils are equal, round, and reactive to light.  Neck: Normal range of motion. Neck supple.  Cardiovascular: Regular rhythm and normal heart sounds.  Tachycardia present.   Exam reveals no gallop and no friction rub.   No murmur heard. Pulmonary/Chest: Effort normal and breath sounds normal. No respiratory distress. She has no wheezes. She has no rales.  Abdominal: Soft. Bowel sounds are normal. She exhibits no distension and no mass. There is no tenderness. There is no rebound and no guarding.  Musculoskeletal: Normal range of motion. She exhibits no edema and no tenderness.  Neurological: She is alert and oriented to person, place, and time.  Skin: Skin is warm and dry.  Psychiatric: She has a normal mood and affect.    ED Course  Procedures (including critical care time) Labs Review Labs Reviewed  BASIC METABOLIC PANEL - Abnormal; Notable for the following:    Glucose, Bld 143 (*)    All other components within normal limits  CBC - Abnormal; Notable for the following:    WBC 10.8 (*)    All other components within normal limits  PRO B NATRIURETIC PEPTIDE  I-STAT TROPOININ, ED   Imaging Review Dg Chest 2 View  03/16/2014   CLINICAL DATA:  Chest pain and pressure.  EXAM: CHEST  2 VIEW  COMPARISON:  None.  FINDINGS: The heart size and mediastinal contours are within normal limits. Both lungs are clear. The visualized skeletal structures are unremarkable.  IMPRESSION: No active cardiopulmonary disease.   Electronically Signed   By: Signa Kellaylor  Stroud M.D.   On: 03/16/2014 17:33   Ct Angio Chest Pe W/cm &/or Wo Cm  03/16/2014   CLINICAL DATA:  Left-sided chest pain that radiates into the left shoulder and arm.  EXAM: CT ANGIOGRAPHY CHEST WITH CONTRAST  TECHNIQUE: Multidetector CT imaging of the chest was performed using the standard protocol during bolus administration of intravenous contrast. Multiplanar CT image reconstructions and MIPs were obtained to evaluate the vascular anatomy.  CONTRAST:  60mL OMNIPAQUE IOHEXOL 350 MG/ML SOLN  COMPARISON:  Chest x-ray dated 03/16/2014  FINDINGS: There are no pulmonary emboli, infiltrates, or effusions. There is a 5 mm  nodule posteriorly at the right upper lobe seen on image 19 of series 6. This is indeterminate in etiology. There is no hilar or mediastinal adenopathy. Heart size is normal.  No osseous abnormalities.  The RV/LV ratio is slightly greater than 1, of unknown etiology.  Review of the MIP images confirms the above findings.  IMPRESSION: No acute abnormalities. 5 mm pulmonary nodule in the right upper lobe is indeterminate but unlikely to be significant in a patient of this age. If the patient is at high risk for bronchogenic carcinoma, follow-up chest CT at 6-12 months is recommended. If the patient is at low risk for bronchogenic carcinoma, follow-up chest CT at 12 months is recommended. This recommendation follows the consensus statement: Guidelines for Management of Small Pulmonary Nodules Detected on CT Scans: A Statement from the Fleischner Society as published in Radiology 2005;237:395-400.  I suggest that the follow-up images be only through the area of concern.   Electronically Signed   By: Geanie CooleyJim  Maxwell M.D.   On: 03/16/2014 20:57     EKG Interpretation   Date/Time:  Monday Labrittany 13 2015 16:49:15 EDT Ventricular Rate:  121 PR Interval:  138 QRS Duration: 72 QT Interval:  320 QTC Calculation: 454 R Axis:   9 Text Interpretation:  Sinus tachycardia Septal infarct , age undetermined  Possible Lateral infarct , age undetermined Abnormal ECG No prior for  comparison Confirmed by Pierson Vantol  MD, Elim Economou [phone]([id number]) on 03/16/2014 6:33:51 PM      MDM   Final diagnoses:  Chest pain    Pt is a 33 y.o. female with Pmhx as above who presents with 1 day of intermittent L sided, sharp CP lasting 20-40 mins, occuring about times today with assoc SOB. During 1 episode she has assoc diaphoresis, lightheadedness and L shoulder radiation. No CP currently. On PE, pt tachycardic, but in NAD. pulm exam, LE benign. No risk factors for PE, +FH of CAD, but no personal risk factors. EKG w/ sinus tachy. Trop, BNP, not  elevated, CXR nml. Given concern for PE, CTA ordered which was negative (except for incidental lung nodule which she will need CT in 1 yr). IVF given, HR normalized. Doubt ACS given age & lack of personal risk factors, but given FH, have encouraged pt to establish with a PCP and to return to ED for new or worsening symptoms.         Shanna CiscoMegan E Aeron Lheureux, MD 03/17/14 [phone]

## 2014-04-20 ENCOUNTER — Encounter: Payer: Self-pay | Admitting: Internal Medicine

## 2014-04-20 ENCOUNTER — Ambulatory Visit (INDEPENDENT_AMBULATORY_CARE_PROVIDER_SITE_OTHER): Payer: 59 | Admitting: Internal Medicine

## 2014-04-20 VITALS — BP 130/98 | HR 80 | Wt 179.0 lb

## 2014-04-20 DIAGNOSIS — R634 Abnormal weight loss: Secondary | ICD-10-CM

## 2014-04-20 DIAGNOSIS — R0602 Shortness of breath: Secondary | ICD-10-CM

## 2014-04-20 DIAGNOSIS — I10 Essential (primary) hypertension: Secondary | ICD-10-CM

## 2014-04-20 MED ORDER — TRIAMTERENE-HCTZ 37.5-25 MG PO TABS: 0.5000 | ORAL_TABLET | Freq: Every day | ORAL | Status: DC

## 2014-04-20 NOTE — Progress Notes (Signed)
HPI Patient is a 33 yo who is referred for evaluation of CP   The [facility] was [facility] earlier this spring.  She says that she went in with feeling of elephant sitting on chest.  (note says sharp)    BP was high at time.  160/110  Patient SOB   Work up was negative.  (except for incidental lung nodule on CT that will need f/u) Patient was started on Toprol about 1 month ago  BP at doctors was high Has had no further spells. She has felt fine over past month though she does not SOB with activity, especially stairs.    Allergies  Allergen Reactions  . Penicillins     unknown    Current Outpatient Prescriptions  Medication Sig Dispense Refill  . metoprolol succinate (TOPROL-XL) 50 MG 24 hr tablet Take 50 mg by mouth daily. Take with or immediately following a meal.       No current facility-administered medications for this visit.    Past Medical History  Diagnosis Date  . Gestational hypertension     Past Surgical History  Procedure Laterality Date  . Arm surgery Right   . Wisdom tooth extraction Bilateral     Family History  Problem Relation Age of Onset  . Hypertension Mother   . Hypertension Father     History   Social History  . Marital Status: Married    Spouse Name: N/A    Number of Children: N/A  . Years of Education: N/A   Occupational History  . Not on file.   Social History Main Topics  . Smoking status: Never Smoker   . Smokeless tobacco: Not on file  . Alcohol Use: Not on file  . Drug Use: Not on file  . Sexual Activity: Not on file   Other Topics Concern  . Not on file   Social History Narrative  . No narrative on file    Review of Systems:  All systems reviewed.  They are negative to the above problem except as previously stated.  Vital Signs: BP 130/98  Pulse 80  Wt 179 lb (81.194 kg)  Physical Exam Patient is ain NAD HEENT:  Normocephalic, atraumatic. EOMI, PERRLA.  Neck: JVP is normal.  No bruits.  Lungs: clear to  auscultation. No rales no wheezes.  Heart: Regular rate and rhythm. Normal S1, S2. No S3.   No significant murmurs. PMI not displaced.  Abdomen:  Supple, nontender. Normal bowel sounds. No masses. No hepatomegaly.  Extremities:   Good distal pulses throughout. No lower extremity edema.  Musculoskeletal :moving all extremities.  Neuro:   alert and oriented x3.  CN II-XII grossly intact.   Assessment and Plan: 1.  HTN  Not optimally controlled  I would continue toprol and add maxzide (1/2 of 37.5/25 mg)  Get echo  F/U in 4 to 6 wks  2.  Dypsnea  Get echo  3  Obesity  Discussed diett Patient would like to lose wt  Having a hard time  WIll refer to dietary.

## 2014-04-20 NOTE — Patient Instructions (Addendum)
Your physician has recommended you make the following change in your medication:   1. Start Maxzide 37.5-25 mg 1/2 tablet daily.  Your physician has requested that you have an echocardiogram. Echocardiography is a painless test that uses sound waves to create images of your heart. It provides your doctor with information about the size and shape of your heart and how well your heart's chambers and valves are working. This procedure takes approximately one hour. There are no restrictions for this procedure.  You have been referred to Nutrition Counseling.   Your physician recommends that you schedule a follow-up appointment in: 6 weeks with Dr. Tenny Crawoss.

## 2014-04-23 ENCOUNTER — Encounter: Payer: Self-pay | Admitting: Dietician

## 2014-04-23 ENCOUNTER — Encounter: Payer: 59 | Attending: Internal Medicine | Admitting: Dietician

## 2014-04-23 VITALS — Ht 63.0 in | Wt 175.2 lb

## 2014-04-23 DIAGNOSIS — E669 Obesity, unspecified: Secondary | ICD-10-CM

## 2014-04-23 DIAGNOSIS — Z6831 Body mass index (BMI) 31.0-31.9, adult: Secondary | ICD-10-CM | POA: Insufficient documentation

## 2014-04-23 DIAGNOSIS — Z713 Dietary counseling and surveillance: Secondary | ICD-10-CM | POA: Insufficient documentation

## 2014-04-23 NOTE — Patient Instructions (Signed)
Eat 3 meals per day (have protein with carbohydrates). Consider having drinking just water (not buying soda, juice, or sweet tea).  Aim to add vegetables to lunch and dinner (frozen vegetables and check what is in season at the [employer]). Continue to get 30-60 minutes of physical each day.  Consider taking a multivitamin.

## 2014-04-23 NOTE — Progress Notes (Signed)
  Medical Nutrition Therapy:  Appt start time: 0800 end time:  0855.   Assessment:  Primary concerns today: Mary Yang is here today since she needs help to lose weight. Her blood pressure is also high. She is trying to eat healthy on a limited budget. Her is the only one working and they have 150-200 dollars per month. Has a busy schedule with 2 kids. Does not eat breakfast and will often not eat until 3:00 PM. Has gained about 30 lbs in the past year and not sure why. Does not have thyroid problem. Feels like she used to eat more and more frequently last year. Eats out Tuesday and Thursday nights (total about 3-4 x week).   Does not want to impact family too much by changing diet. Current weight is highest and higher than pregnancy weight. Normal weight is around 150 lbs. Has some back pain which limits ability to walk. Sometimes will do water exercise in the summer.  Does not sleep well d/t hot temperature of her bedroom.   Preferred Learning Style:   No preference indicated   Learning Readiness:   Ready  MEDICATIONS: see list    DIETARY INTAKE:  Usual eating pattern includes 1-2 meals and 0-1 snacks per day.  Avoided foods include: avocados    24-hr recall:  B ( AM): none, sometimes coffee Snk ( AM): none  L ( PM): will skip 4 days per week, smoothie with orange juice, LF yogurt and frozen fruit, [employer] grilled nuggets, sweet tea, biscuit (leftover from kids) Snk ( PM): none D ( PM): when has dance for kids, will have pizza or subs from [employer] or [employer], pasta with garlic and olive oil or alfredo and vegetable Snk ( PM):occaisionally will have chips and dip or fruit Beverages: 60 oz water, 1 glass juice, 1 can regular soda, sometimes will have sweet tea, sometimes will have milk  Usual physical activity: walks 30 minutes 3-4 x week   Estimated energy needs: 1800 calories 200 g carbohydrates 135 g protein 50 g fat  Progress Towards Goal(s):  In  progress.   Nutritional Diagnosis:  [place]-3.3 Overweight/obesity As related to hx of meal skipping and high intake of processed foods.  As evidenced by BMI of 31.0.    Intervention:  Nutrition counseling provided. Plan: Eat 3 meals per day (have protein with carbohydrates). Consider having drinking just water (not buying soda, juice, or sweet tea).  Aim to add vegetables to lunch and dinner (frozen vegetables and check what is in season at the [employer]). Continue to get 30-60 minutes of physical each day.  Consider taking a multivitamin.  Teaching Method Utilized:  Visual Auditory Hands on  Handouts given during visit include:  MyPlate Handout  15 g CHO Snacks  Barriers to learning/adherence to lifestyle change: limited income  Demonstrated degree of understanding via:  Teach Back   Monitoring/Evaluation:  Dietary intake, exercise, and body weight in 2 month(s).

## 2014-04-24 ENCOUNTER — Ambulatory Visit (HOSPITAL_COMMUNITY): Payer: 59 | Attending: Cardiology | Admitting: Cardiology

## 2014-04-24 DIAGNOSIS — I10 Essential (primary) hypertension: Secondary | ICD-10-CM | POA: Insufficient documentation

## 2014-04-24 DIAGNOSIS — R0602 Shortness of breath: Secondary | ICD-10-CM | POA: Insufficient documentation

## 2014-04-24 NOTE — Progress Notes (Signed)
Echo performed. 

## 2014-06-04 ENCOUNTER — Encounter: Payer: Self-pay | Admitting: Internal Medicine

## 2014-06-04 ENCOUNTER — Ambulatory Visit (INDEPENDENT_AMBULATORY_CARE_PROVIDER_SITE_OTHER): Payer: 59 | Admitting: Internal Medicine

## 2014-06-04 VITALS — BP 124/86 | HR 72 | Ht 63.0 in | Wt 175.4 lb

## 2014-06-04 DIAGNOSIS — I10 Essential (primary) hypertension: Secondary | ICD-10-CM

## 2014-06-04 LAB — BASIC METABOLIC PANEL
BUN: 11 mg/dL (ref 6–23)
CHLORIDE: 98 meq/L (ref 96–112)
CO2: 29 mEq/L (ref 19–32)
Calcium: 9.8 mg/dL (ref 8.4–10.5)
Creat: 0.68 mg/dL (ref 0.50–1.10)
Glucose, Bld: 92 mg/dL (ref 70–99)
POTASSIUM: 4 meq/L (ref 3.5–5.3)
Sodium: 136 mEq/L (ref 135–145)

## 2014-06-04 LAB — MAGNESIUM: Magnesium: 2.3 mg/dL (ref 1.5–2.5)

## 2014-06-04 NOTE — Progress Notes (Signed)
HPI Patient is a 33 yo who is referred earlier this spring for CP Like elephant in chest  BP High when seen in ER.  Work up   was negative.  (except for incidental lung nodule on CT that will need f/u) Patient was placed on Toprol  When I saw her I started Maxzide. Since seen she denies signif CP Has had some light headedness when had cold.    Allergies  Allergen Reactions  . Penicillins     unknown    Current Outpatient Prescriptions  Medication Sig Dispense Refill  . metoprolol succinate (TOPROL-XL) 50 MG 24 hr tablet Take 50 mg by mouth daily. Take with or immediately following a meal.      . triamterene-hydrochlorothiazide (MAXZIDE-25) 37.5-25 MG per tablet Take 0.5 tablets by mouth daily.  15 tablet  6   No current facility-administered medications for this visit.    Past Medical History  Diagnosis Date  . Gestational hypertension     Past Surgical History  Procedure Laterality Date  . Arm surgery Right   . Wisdom tooth extraction Bilateral     Family History  Problem Relation Age of Onset  . Hypertension Mother   . Hypertension Father   . Cancer Other   . Stroke Other   . Heart disease Other     History   Social History  . Marital Status: Married    Spouse Name: N/A    Number of Children: N/A  . Years of Education: N/A   Occupational History  . Not on file.   Social History Main Topics  . Smoking status: Never Smoker   . Smokeless tobacco: Not on file  . Alcohol Use: Not on file  . Drug Use: Not on file  . Sexual Activity: Not on file   Other Topics Concern  . Not on file   Social History Narrative  . No narrative on file    Review of Systems:  All systems reviewed.  They are negative to the above problem except as previously stated.  Vital Signs: BP 124/86  Pulse 72  Ht 5\' 3"  (1.6 m)  Wt 175 lb 6.4 oz (79.561 kg)  BMI 31.08 kg/m2  Physical Exam Patient is ain NAD HEENT:  Normocephalic, atraumatic. EOMI, PERRLA.  Neck: JVP is normal.   No bruits.  Lungs: clear to auscultation. No rales no wheezes.  Heart: Regular rate and rhythm. Normal S1, S2. No S3.   No significant murmurs. PMI not displaced.  Abdomen:  Supple, nontender. Normal bowel sounds. No masses. No hepatomegaly.  Extremities:   Good distal pulses throughout. No lower extremity edema.  Musculoskeletal :moving all extremities.  Neuro:   alert and oriented x3.  CN II-XII grossly intact.   Assessment and Plan: 1.  HTN  BP is controlled  Keep on current regimen.    If dizzy can could try cutting toprol in 1/2  2.  Dypsnea  Echo normal.

## 2014-06-04 NOTE — Patient Instructions (Signed)
Your physician recommends that you continue on your current medications as directed. Please refer to the Current Medication list given to you today.  Your physician recommends that you return for lab work in: today. (BMET, MAGNESIUM)  Your physician wants you to follow-up in: Dec/Jan 2016 with Dr. Tenny Crawoss.   You will receive a reminder letter in the mail two months in advance. If you don't receive a letter, please call our office to schedule the follow-up appointment.

## 2014-06-23 ENCOUNTER — Ambulatory Visit: Payer: Self-pay | Admitting: Dietician

## 2014-08-04 ENCOUNTER — Other Ambulatory Visit: Payer: Self-pay | Admitting: Family Medicine

## 2014-08-04 ENCOUNTER — Other Ambulatory Visit (HOSPITAL_COMMUNITY)
Admission: RE | Admit: 2014-08-04 | Discharge: 2014-08-04 | Disposition: A | Discharge status: Home / Self Care | Payer: 59 | Source: Ambulatory Visit | Attending: Family Medicine | Admitting: Family Medicine

## 2014-08-04 DIAGNOSIS — Z124 Encounter for screening for malignant neoplasm of cervix: Secondary | ICD-10-CM | POA: Diagnosis not present

## 2014-08-04 DIAGNOSIS — Z1151 Encounter for screening for human papillomavirus (HPV): Secondary | ICD-10-CM | POA: Diagnosis present

## 2014-08-07 LAB — CYTOLOGY - PAP

## 2014-09-15 ENCOUNTER — Encounter: Payer: Self-pay | Admitting: Internal Medicine

## 2014-09-18 ENCOUNTER — Other Ambulatory Visit: Payer: Self-pay

## 2014-10-28 ENCOUNTER — Encounter: Payer: Self-pay | Admitting: Internal Medicine

## 2014-12-27 NOTE — Progress Notes (Signed)
HPI Patient is a 34 yo who is referred earlier this spring for CP Like elephant in chest  BP High when seen in ER.  Work up   was negative.  (except for incidental lung nodule on CT that will need f/u) Patient was placed on Toprol  When I saw her I started Maxzide. Since seen in July She has done well  Active  Had one week of chest rpessure that she attrib to work  None now. BP usually 120-130/80s when takes it at home    No SOB  No edema   Allergies  Allergen Reactions  . Penicillins     unknown    Current Outpatient Prescriptions  Medication Sig Dispense Refill  . metoprolol succinate (TOPROL-XL) 50 MG 24 hr tablet Take 50 mg by mouth daily. Take with or immediately following a meal.    . triamterene-hydrochlorothiazide (MAXZIDE-25) 37.5-25 MG per tablet Take 0.5 tablets by mouth daily. 15 tablet 6   No current facility-administered medications for this visit.    Past Medical History  Diagnosis Date  . Gestational hypertension     Past Surgical History  Procedure Laterality Date  . Arm surgery Right   . Wisdom tooth extraction Bilateral     Family History  Problem Relation Age of Onset  . Hypertension Mother   . Hypertension Father   . Cancer Other   . Stroke Other   . Heart disease Other     History   Social History  . Marital Status: Married    Spouse Name: N/A    Number of Children: N/A  . Years of Education: N/A   Occupational History  . Not on file.   Social History Main Topics  . Smoking status: Never Smoker   . Smokeless tobacco: Not on file  . Alcohol Use: Not on file  . Drug Use: Not on file  . Sexual Activity: Not on file   Other Topics Concern  . Not on file   Social History Narrative    Review of Systems:  All systems reviewed.  They are negative to the above problem except as previously stated.  Vital Signs: BP 130/96 mmHg  Pulse 77  Ht 5\' 3"  (1.6 m)  Wt 175 lb 1.9 oz (79.434 kg)  BMI 31.03 kg/m2  Physical Exam Patient is in  NAD HEENT:  Normocephalic, atraumatic. EOMI, PERRLA.  Neck: JVP is normal.  No bruits.  Lungs: clear to auscultation. No rales no wheezes.  Heart: Regular rate and rhythm. Normal S1, S2. No S3.   No significant murmurs. PMI not displaced.  Abdomen:  Supple, nontender. Normal bowel sounds. No masses. No hepatomegaly.  Extremities:   Good distal pulses throughout. No lower extremity edema.  Musculoskeletal :moving all extremities.  Neuro:   alert and oriented x3.  CN II-XII grossly intact.   Assessment and Plan: 1.  HTN  BP is a little up  She has not taken meds  Better other days  Would contine    2.  Chest pressure  Atypical  Prb related to stress of job/kids  None atpresent  3  Pulmonary nodule  Will set up f/u CT for [place]  4.  HCM  Check fasting lipids when she comes in for preCT blood work  The [employer]et BMETatthat time as well.    5  Activity  Encouraged to stay active

## 2014-12-28 ENCOUNTER — Encounter: Payer: Self-pay | Admitting: Internal Medicine

## 2014-12-28 ENCOUNTER — Other Ambulatory Visit: Payer: Self-pay | Admitting: *Deleted

## 2014-12-28 ENCOUNTER — Ambulatory Visit (INDEPENDENT_AMBULATORY_CARE_PROVIDER_SITE_OTHER): Payer: 59 | Admitting: Internal Medicine

## 2014-12-28 VITALS — BP 130/96 | HR 77 | Ht 63.0 in | Wt 175.1 lb

## 2014-12-28 DIAGNOSIS — R911 Solitary pulmonary nodule: Secondary | ICD-10-CM

## 2014-12-28 DIAGNOSIS — I10 Essential (primary) hypertension: Secondary | ICD-10-CM

## 2014-12-28 NOTE — Patient Instructions (Addendum)
Non-Cardiac CT scanning, (CAT scanning), is a noninvasive, special x-ray that produces cross-sectional images of the body using x-rays and a computer. CT scans help physicians diagnose and treat medical conditions. For some CT exams, a contrast material is used to enhance visibility in the area of the body being studied. CT scans provide greater clarity and reveal more details than regular x-ray exams.  Your physician recommends that you continue on your current medications as directed. Please refer to the Current Medication list given to you today. Your physician recommends that you return for lab work in: Mary Yang 2016  (SCHEDULE WITHIN 3 WEEKS PRIOR TO CT SCAN) (LIPIDS, BMET)  Your physician wants you to follow-up in: October 2016 WITH DR ROSS.  You will receive a reminder letter in the mail two months in advance. If you don't receive a letter, please call our office to schedule the follow-up appointment.

## 2015-01-22 ENCOUNTER — Other Ambulatory Visit: Payer: Self-pay | Admitting: Internal Medicine

## 2015-04-01 ENCOUNTER — Ambulatory Visit (INDEPENDENT_AMBULATORY_CARE_PROVIDER_SITE_OTHER)
Admission: RE | Admit: 2015-04-01 | Discharge: 2015-04-01 | Disposition: A | Discharge status: Home / Self Care | Payer: 59 | Source: Ambulatory Visit | Attending: Internal Medicine | Admitting: Internal Medicine

## 2015-04-01 DIAGNOSIS — R911 Solitary pulmonary nodule: Secondary | ICD-10-CM | POA: Diagnosis not present

## 2015-08-02 ENCOUNTER — Other Ambulatory Visit: Payer: Self-pay | Admitting: *Deleted

## 2015-08-02 MED ORDER — TRIAMTERENE-HCTZ 37.5-25 MG PO TABS: 0.5000 | ORAL_TABLET | Freq: Every day | ORAL | Status: DC

## 2016-01-06 IMAGING — CT CT CHEST W/O CM
2 of 4 series · 15 of 36 positions shown, 18 images · non-contrast
Comparison: 03/16/2014

CLINICAL DATA: Follow-up pulmonary nodule

EXAM:
CT CHEST WITHOUT CONTRAST
TECHNIQUE: Multidetector CT imaging of the chest was performed following the
standard protocol without IV contrast.

[Series 2: chest routine with · axial · 0.69mm/px · z∈[-300,-60]mm · 12 of 58 slices shown, 15 images]
[im 5/58  mediastinal]
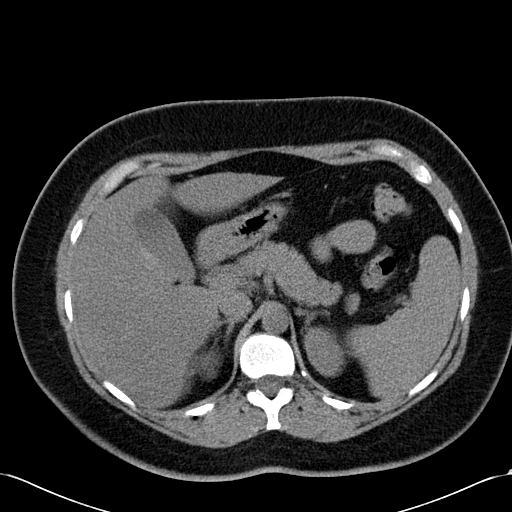
[im 5/58  lung]
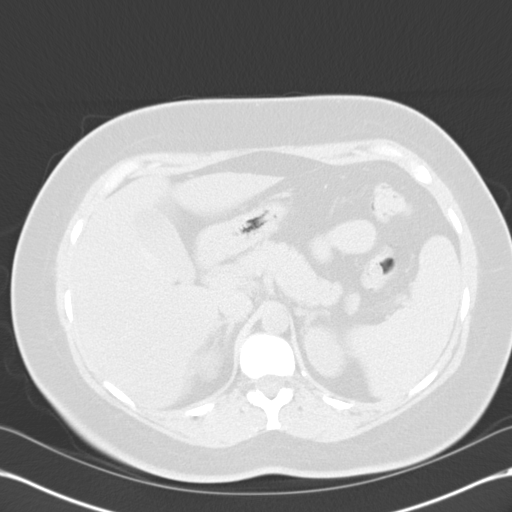
[im 9/58  lung]
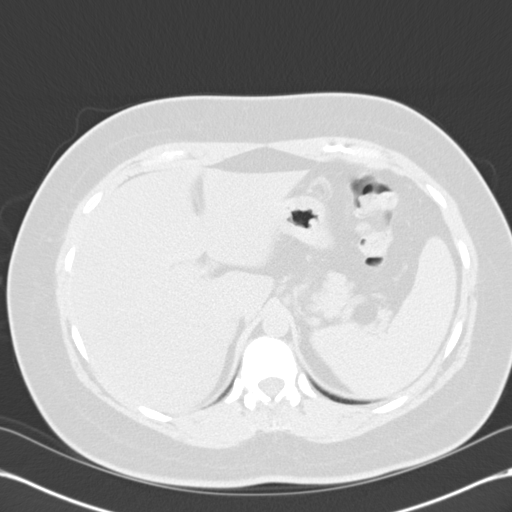
[im 14/58  lung]
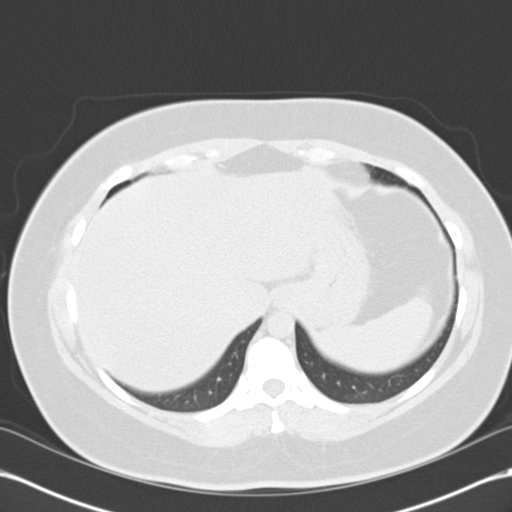
[im 18/58  lung]
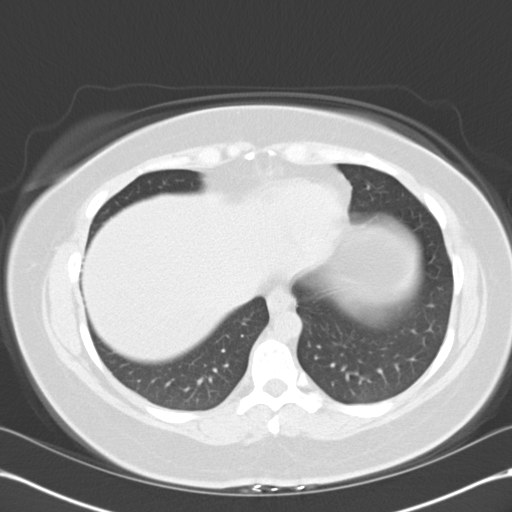
[im 22/58  mediastinal]
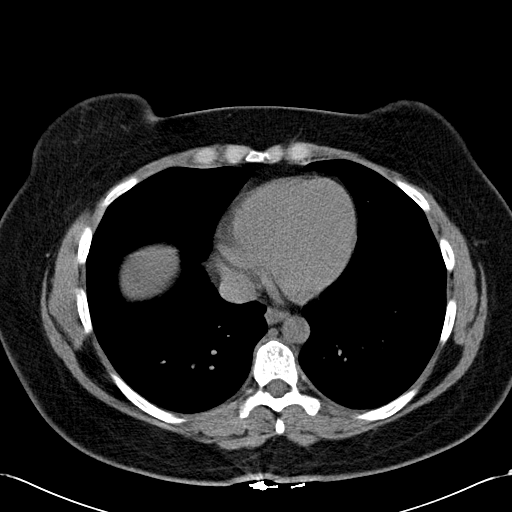
[im 22/58  lung]
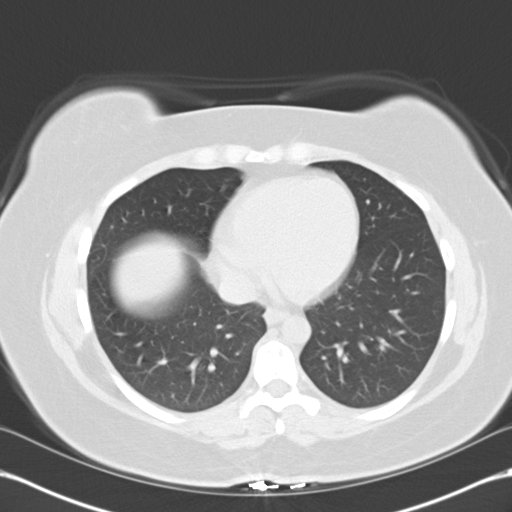
[im 27/58  lung]
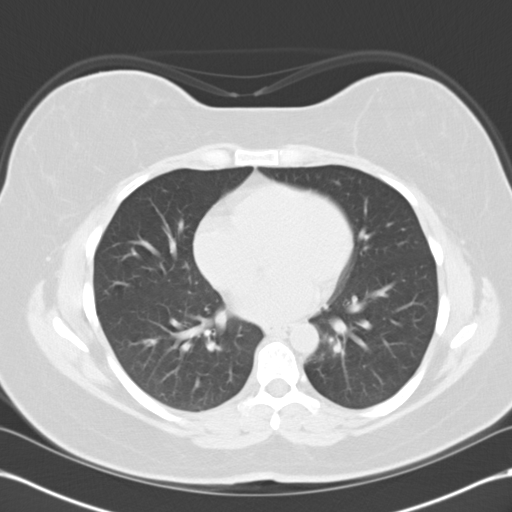
[im 31/58  lung]
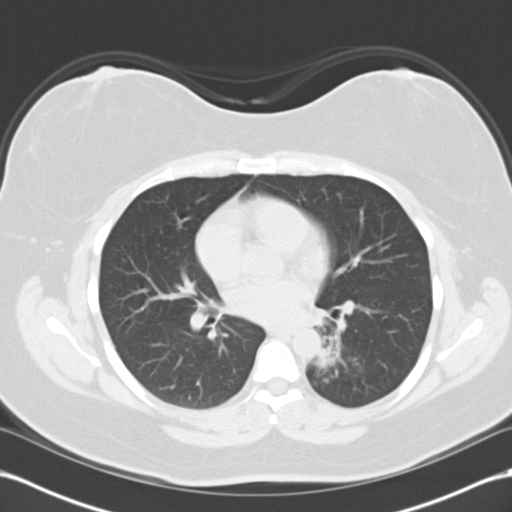
[im 36/58  lung]
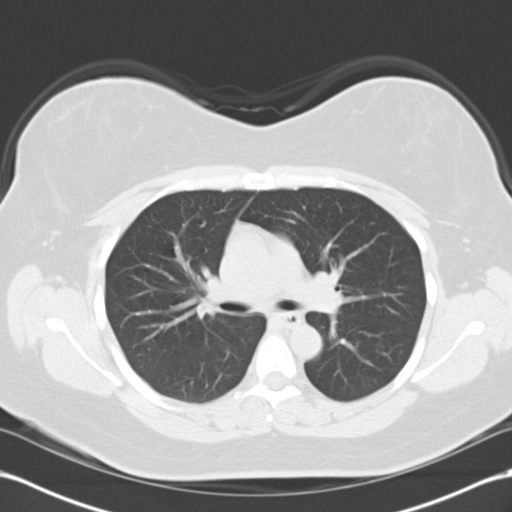
[im 40/58  mediastinal]
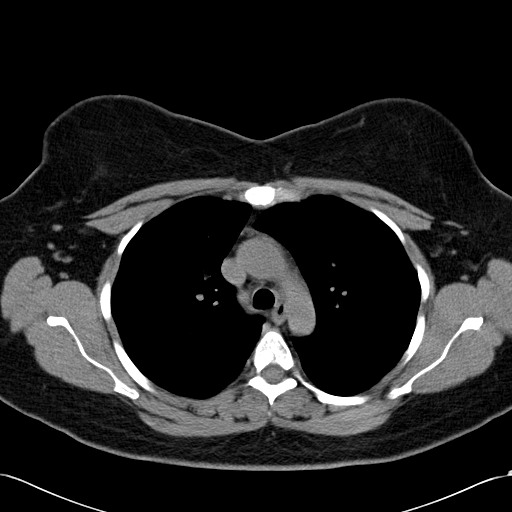
[im 40/58  lung]
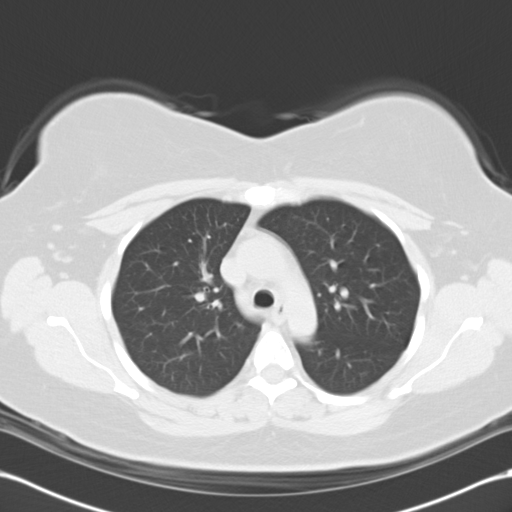
[im 44/58  lung]
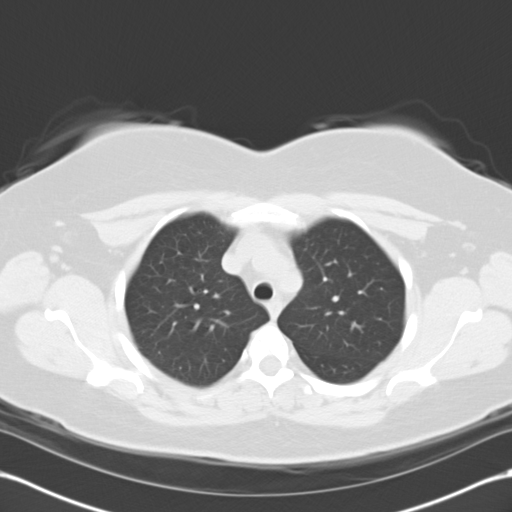
[im 49/58  lung]
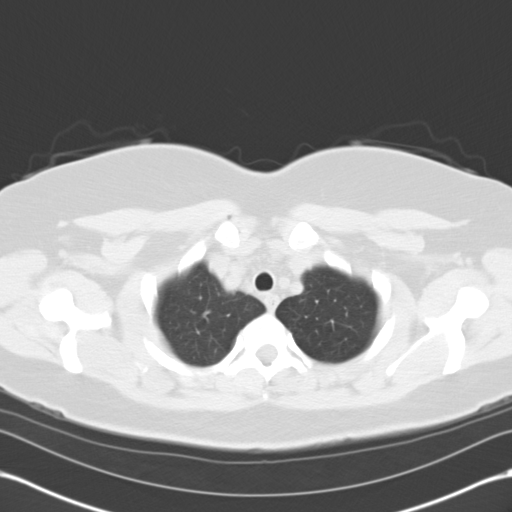
[im 53/58  lung]
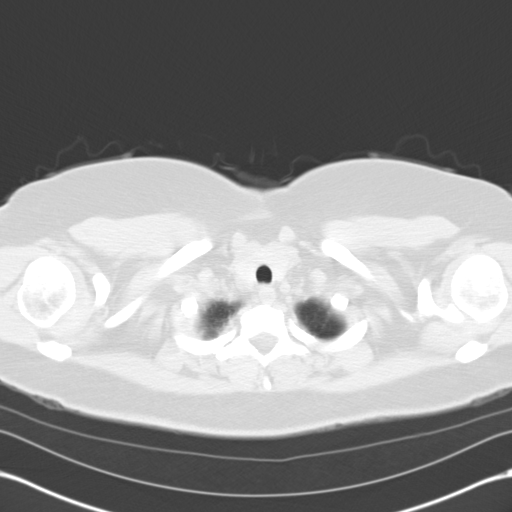

[Series 602: cor · coronal · 0.69mm/px · 3 of 111 slices shown]
[im 23/111  lung]
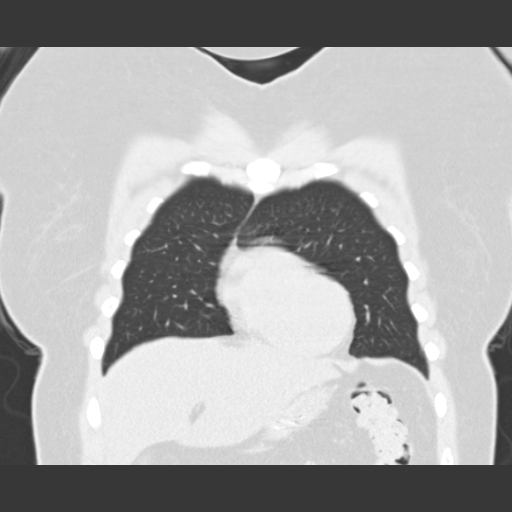
[im 45/111  lung]
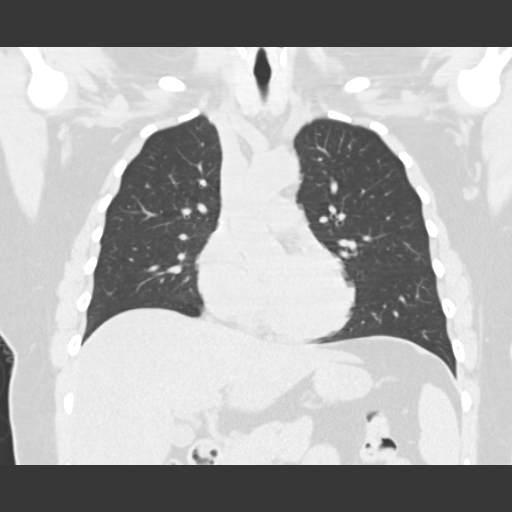
[im 67/111  lung]
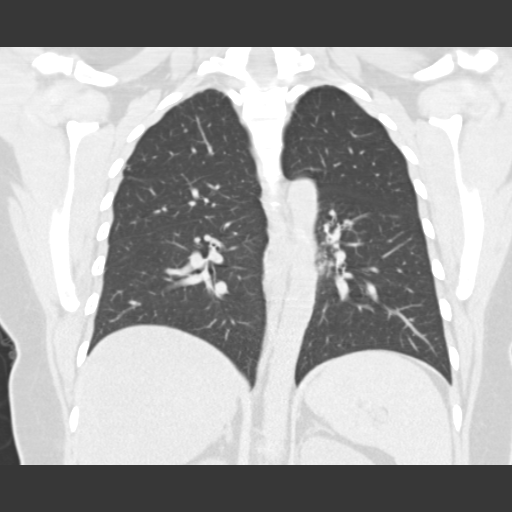

[15 of 36 positions shown; findings below may reference images not displayed]

FINDINGS: Mediastinum/Nodes: The heart is normal in size. No pericardial
effusion.

No suspicious mediastinal or axillary lymphadenopathy.

Visualized thyroid is unremarkable.

Lungs/Pleura: 3 x 5 mm subpleural nodule in the posterior right
upper lobe (series 3/ image 13), unchanged since 7628, benign given
12 month stability.

Clustered tree-in-bud nodularity in the medial left lower lobe
(Series 3/ image 27), likely infectious/ inflammatory.

No pleural effusion or pneumothorax.

Upper abdomen: Visualized upper abdomen is unremarkable.

Musculoskeletal: Visualized osseous structures are within normal
limits.
IMPRESSION: 3 x 5 mm subpleural nodule in the posterior right upper lobe,
unchanged since 7628, benign given 12 month stability.

Clustered tree-in-bud nodularity in the medial left lower lobe,
likely infectious/ inflammatory.

Dedicated follow-up imaging is not required per [HOSPITAL]
guidelines.

This recommendation follows the consensus statement: Guidelines for
Management of Small Pulmonary Nodules Detected on CT Scans: A
Statement from the [HOSPITAL] as published in Radiology

## 2019-04-24 ENCOUNTER — Telehealth: Payer: Self-pay | Admitting: Diagnostic Neuroimaging

## 2019-04-24 NOTE — Telephone Encounter (Signed)
Pt gave consent for a Video Visit with our office. Pt understands that although there may be some limitations with this type of visit, we will take all precautions to reduce any security or privacy concerns.  Pt understands that this will be treated like an in office visit and we will file with pt's insurance, and there may be a patient responsible charge related to this service. °

## 2019-04-29 ENCOUNTER — Other Ambulatory Visit: Payer: Self-pay | Admitting: *Deleted

## 2019-04-29 ENCOUNTER — Encounter: Payer: Self-pay | Admitting: *Deleted

## 2019-04-29 NOTE — Telephone Encounter (Signed)
LVM requesting call back to update EMR.  

## 2019-04-30 ENCOUNTER — Encounter: Payer: Self-pay | Admitting: Diagnostic Neuroimaging

## 2019-04-30 ENCOUNTER — Ambulatory Visit (INDEPENDENT_AMBULATORY_CARE_PROVIDER_SITE_OTHER): Payer: 59 | Admitting: Diagnostic Neuroimaging

## 2019-04-30 ENCOUNTER — Other Ambulatory Visit: Payer: Self-pay

## 2019-04-30 DIAGNOSIS — M6281 Muscle weakness (generalized): Secondary | ICD-10-CM

## 2019-04-30 DIAGNOSIS — H814 Vertigo of central origin: Secondary | ICD-10-CM

## 2019-04-30 NOTE — Progress Notes (Signed)
[facility]  PATIENT: Mary Yang DOB: Feb 03, 1981  REFERRING CLINICIAN: V Rankins HISTORY FROM: patient  REASON FOR VISIT: new consult    HISTORICAL  CHIEF COMPLAINT: dizziness, weakness   HISTORY OF PRESENT ILLNESS:   38 year old female with hypertension here for evaluation of dizziness.  Patient was diagnosed with hypertension in 2011 was on medication until 2016.  She then changed doctors and came off of medications.  She was not on medications until recently in 2021 to establish with new doctors.  Before starting medication she had blood pressure reading of 201/160.  Occasionally she would feel chest pressure, flushing, other sensations when she would have high blood pressure attacks.  Since starting blood pressure medications she is having a new problem.  Now she is having intermittent episodes of lightheadedness, dizziness, weakness, fogginess sensation.  Episodes come on fairly suddenly and without warning.  They can occur in any situation.  She was able to check her blood pressure with 1 of these attacks recently and blood pressure was 140/90 and heart rate was greater than 100.    REVIEW OF SYSTEMS: Full 14 system review of systems performed and negative with exception of: As per HPI.  ALLERGIES: Allergies  Allergen Reactions  . Penicillins Hives    HOME MEDICATIONS: Outpatient Medications Prior to Visit  Medication Sig Dispense Refill  . albuterol (VENTOLIN HFA) 108 (90 Base) MCG/ACT inhaler 1 (ONE) PUFF FOUR TIMES DAILY, AS NEEDED    . ASPIRIN ADULT LOW STRENGTH 81 MG EC tablet Take 81 mg by mouth daily.    . hydrochlorothiazide (HYDRODIURIL) 50 MG tablet Take 50 mg by mouth daily.    . metoprolol succinate (TOPROL-XL) 50 MG 24 hr tablet Take 50 mg by mouth daily. Take with or immediately following a meal.    . sertraline (ZOLOFT) 50 MG tablet TAKE 1 TABLET BY MOUTH EVERYDAY AT BEDTIME    . temazepam (RESTORIL) 15 MG capsule Take 15 mg by mouth  at bedtime.    . triamterene-hydrochlorothiazide (MAXZIDE-25) 37.5-25 MG per tablet Take 0.5 tablets by mouth daily. 45 tablet 0  . Vitamin D, Ergocalciferol, (DRISDOL) 1.25 MG (50000 UT) CAPS capsule Take 50,000 Units by mouth once a week.     No facility-administered medications prior to visit.     PAST MEDICAL HISTORY: Past Medical History:  Diagnosis Date  . Asthma   . Dizziness   . Gestational hypertension   . Hypercholesteremia   . Lightheadedness   . Near syncope   . Palpitations   . Vitamin D deficiency     PAST SURGICAL HISTORY: Past Surgical History:  Procedure Laterality Date  . arm surgery Right   . WISDOM TOOTH EXTRACTION Bilateral     FAMILY HISTORY: Family History  Problem Relation Age of Onset  . Hypertension Mother   . Hyperlipidemia Mother   . Diabetes Mother   . Hypertension Father   . Cancer Other   . Stroke Other   . Heart disease Other   . Hypertension Brother     SOCIAL HISTORY: Social History   Socioeconomic History  . Marital status: Married    Spouse name: Not on file  . Number of children: Not on file  . Years of education: Not on file  . Highest education level: Not on file  Occupational History  . Not on file  Social Needs  . Financial resource strain: Not on file  . Food insecurity:    Worry: Not on file  Inability: Not on file  . Transportation needs:    Medical: Not on file    Non-medical: Not on file  Tobacco Use  . Smoking status: Never Smoker  . Smokeless tobacco: Never Used  Substance and Sexual Activity  . Alcohol use: Yes    Comment: occas  . Drug use: Never  . Sexual activity: Not on file  Lifestyle  . Physical activity:    Days per week: Not on file    Minutes per session: Not on file  . Stress: Not on file  Relationships  . Social connections:    Talks on phone: Not on file    Gets together: Not on file    Attends religious service: Not on file    Active member of club or organization: Not on file     Attends meetings of clubs or organizations: Not on file    Relationship status: Not on file  . Intimate partner violence:    Fear of current or ex partner: Not on file    Emotionally abused: Not on file    Physically abused: Not on file    Forced sexual activity: Not on file  Other Topics Concern  . Not on file  Social History Narrative  . Not on file     PHYSICAL EXAM   VIDEO EXAM  GENERAL EXAM/CONSTITUTIONAL:  Vitals: There were no vitals filed for this visit.  There is no height or weight on file to calculate BMI. Wt Readings from Last 3 Encounters:  12/28/14 175 lb 1.9 oz (79.4 kg)  06/04/14 175 lb 6.4 oz (79.6 kg)  04/23/14 175 lb 3.2 oz (79.5 kg)     Patient is in no distress; well developed, nourished and groomed; neck is supple   NEUROLOGIC: MENTAL STATUS:  No flowsheet data found.  awake, alert, oriented to person, place and time  recent and remote memory intact  normal attention and concentration  language fluent, comprehension intact, naming intact  fund of knowledge appropriate  CRANIAL NERVE:   2nd, 3rd, 4th, 6th - visual fields full to confrontation, extraocular muscles intact, no nystagmus  5th - facial sensation symmetric  7th - facial strength symmetric  8th - hearing intact  11th - shoulder shrug symmetric  12th - tongue protrusion midline  MOTOR:   NO TREMOR; NO DRIFT IN BUE  SENSORY:   normal and symmetric to light touch  COORDINATION:   fine finger movements normal     DIAGNOSTIC DATA (LABS, IMAGING, TESTING) - I reviewed patient records, labs, notes, testing and imaging myself where available.  Lab Results  Component Value Date   WBC 10.8 (H) 03/16/2014   HGB 14.1 03/16/2014   HCT 41.2 03/16/2014   MCV 87.1 03/16/2014   PLT 274 03/16/2014      Component Value Date/Time   NA 136 06/04/2014 1613   K 4.0 06/04/2014 1613   CL 98 06/04/2014 1613   CO2 29 06/04/2014 1613   GLUCOSE 92 06/04/2014 1613    BUN 11 06/04/2014 1613   CREATININE 0.68 06/04/2014 1613   CALCIUM 9.8 06/04/2014 1613   PROT 7.5 07/21/2010 1310   ALBUMIN 4.5 07/21/2010 1310   AST 36 07/21/2010 1310   ALT 50 (H) 07/21/2010 1310   ALKPHOS 103 07/21/2010 1310   BILITOT 1.5 (H) 07/21/2010 1310   GFRNONAA >90 03/16/2014 1657   GFRAA >90 03/16/2014 1657   Lab Results  Component Value Date   CHOL 244 (H) 06/16/2010   HDL 69.10  06/16/2010   LDLDIRECT 163.7 06/16/2010   TRIG 93.0 06/16/2010   CHOLHDL 4 06/16/2010   No results found for: HGBA1C No results found for: VITAMINB12 Lab Results  Component Value Date   TSH 0.58 06/16/2010       ASSESSMENT AND PLAN  38 y.o. year old female here with new onset lightheadedness, dizziness, spinning sensation, numbness, weakness, intermittently starting blood pressure medications next few months.  Could be a transition of symptoms as patient is getting used to normotension.  Will check MRI to rule out other neurologic causes.   Dx:  1. Muscle weakness (generalized)   2. Vertigo of central origin     Virtual Visit via Video Note  I connected with Mary Yang on 04/30/19 at  4:30 PM EDT by a video enabled telemedicine application and verified that I am speaking with the correct person using two identifiers.  Location: Patient: home Provider: office   I discussed the limitations of evaluation and management by telemedicine and the availability of in person appointments. The patient expressed understanding and agreed to proceed.   I discussed the assessment and treatment plan with the patient. The patient was provided an opportunity to ask questions and all were answered. The patient agreed with the plan and demonstrated an understanding of the instructions.   The patient was advised to call back or seek an in-person evaluation if the symptoms worsen or if the condition fails to improve as anticipated.  I provided 30 minutes of non-face-to-face time during this  encounter.   PLAN:  - check MRI brain, MRA head / neck (eval dizziness, lightheadedness, episodic weakness)  Orders Placed This Encounter  Procedures  . MR BRAIN W WO CONTRAST  . MR MRA HEAD WO CONTRAST  . MR MRA NECK W WO CONTRAST   Return pending testing, for pending if symptoms worsen or fail to improve.    Suanne Marker, MD 04/30/2019, 4:43 PM Certified in Neurology, Neurophysiology and Neuroimaging  [facility] [address] [place], [place] [address] [phone]

## 2019-05-05 ENCOUNTER — Telehealth: Payer: Self-pay | Admitting: Diagnostic Neuroimaging

## 2019-05-05 NOTE — Telephone Encounter (Signed)
[employer] pending faxed clinical notes.  Case # [id number],  [phone] [id number]

## 2019-05-06 NOTE — Telephone Encounter (Signed)
I called to check the status it is still pending they received the fax of clinical notes.

## 2019-05-08 NOTE — Telephone Encounter (Signed)
The MRI Brain w/wo contrast was approved [employer] Auth: [id number] (exp. 05/07/19 to 06/21/19).  The MRA Head wo contrast and the MRA Neck w/wo contrast was not approved. The phone number to call to schedule a peer to peer is [phone] option 1. It needs to be schedule within 14 calender days. They suggest sooner than the 14 calender days. The case number for the MRA Head is [id number] & the MRA Neck case number is [id number].

## 2019-05-08 NOTE — Telephone Encounter (Signed)
I left a voicemail for the patient to call me back about scheduling the MRI.

## 2019-05-08 NOTE — Telephone Encounter (Signed)
Ok to proceed with MRI brain first; then may consider MRA studies. -VRP

## 2019-05-08 NOTE — Telephone Encounter (Signed)
Noted  

## 2022-04-13 DIAGNOSIS — H3561 Retinal hemorrhage, right eye: Secondary | ICD-10-CM | POA: Diagnosis not present

## 2022-05-02 ENCOUNTER — Encounter (HOSPITAL_BASED_OUTPATIENT_CLINIC_OR_DEPARTMENT_OTHER): Payer: Self-pay | Admitting: Family Medicine

## 2022-05-02 ENCOUNTER — Ambulatory Visit (INDEPENDENT_AMBULATORY_CARE_PROVIDER_SITE_OTHER): Payer: BC Managed Care – PPO | Admitting: Family Medicine

## 2022-05-02 DIAGNOSIS — E559 Vitamin D deficiency, unspecified: Secondary | ICD-10-CM

## 2022-05-02 DIAGNOSIS — I10 Essential (primary) hypertension: Secondary | ICD-10-CM

## 2022-05-02 DIAGNOSIS — R7989 Other specified abnormal findings of blood chemistry: Secondary | ICD-10-CM | POA: Diagnosis not present

## 2022-05-02 MED ORDER — AMLODIPINE BESYLATE 5 MG PO TABS: 5.0000 mg | ORAL_TABLET | Freq: Every day | ORAL | 1 refills | Status: DC

## 2022-05-02 NOTE — Progress Notes (Signed)
New Patient Office Visit  Subjective    Patient ID: Mary Yang, female    DOB: 22-Sep-1981  Age: 41 y.o. MRN: [id number]  CC:  Chief Complaint  Patient presents with   New Patient (Initial Visit)    Pt stated she would like to get back on her bp medication if possible    HPI [name] presents to establish care Last PCP - Courtney Paris, Toma Copier, last saw in 2020  HTN: Was taking triamterene-HCTZ in the past, has been a few years as she has not been following with PCP. Checking BP at home, tends to be about 190/110. Denies any current chest pain, some tightness at times. No headaches. Was prescribed metoprolol at one point, but indicates that she had not been taking this when she stopped following up with PCP.  Depression: Treated previously with sertraline, has not been taking recently.  Does feel that she has some symptoms currently, PHQ-9 completed today with total score of 10.  She indicates that when she was seeing her PCP at [facility], she was also tested for low testosterone and found to have low levels of testosterone.  Reports at the time that it was discussed about possibly being on testosterone replacement with topical medication but had concerns at the time regarding this.  Today she thinks that she may want to consider proceeding with this.  Vitamin D deficiency: No supplementation since 2020, was taking 50,000 unit once weekly previously.  Patient is originally from [place], has been living here for many years (1996). Patient works in Engineering geologist, [employer], also works a couple extra jobs separately. Outside of work, patient enjoys Clinical cytogeneticist, spend time with family.  Outpatient Encounter Medications as of 05/02/2022  Medication Sig   amLODipine (NORVASC) 5 MG tablet Take 1 tablet (5 mg total) by mouth daily.   albuterol (VENTOLIN HFA) 108 (90 Base) MCG/ACT inhaler 1 (ONE) PUFF FOUR TIMES DAILY, AS NEEDED (Patient not taking: Reported on 05/02/2022)   metoprolol succinate (TOPROL-XL) 50 MG  24 hr tablet Take 50 mg by mouth daily. Take with or immediately following a meal. (Patient not taking: Reported on 05/02/2022)   sertraline (ZOLOFT) 50 MG tablet TAKE 1 TABLET BY MOUTH EVERYDAY AT BEDTIME (Patient not taking: Reported on 05/02/2022)   temazepam (RESTORIL) 15 MG capsule Take 15 mg by mouth at bedtime. (Patient not taking: Reported on 05/02/2022)   triamterene-hydrochlorothiazide (MAXZIDE-25) 37.5-25 MG per tablet Take 0.5 tablets by mouth daily. (Patient not taking: Reported on 05/02/2022)   Vitamin D, Ergocalciferol, (DRISDOL) 1.25 MG (50000 UT) CAPS capsule Take 50,000 Units by mouth once a week. (Patient not taking: Reported on 05/02/2022)   [DISCONTINUED] ASPIRIN ADULT LOW STRENGTH 81 MG EC tablet Take 81 mg by mouth daily. (Patient not taking: Reported on 05/02/2022)   [DISCONTINUED] hydrochlorothiazide (HYDRODIURIL) 50 MG tablet Take 50 mg by mouth daily. (Patient not taking: Reported on 05/02/2022)   No facility-administered encounter medications on file as of 05/02/2022.    Past Medical History:  Diagnosis Date   Asthma    Dizziness    Gestational hypertension    Hypercholesteremia    Lightheadedness    Near syncope    Palpitations    Vitamin D deficiency     Past Surgical History:  Procedure Laterality Date   arm surgery Right    WISDOM TOOTH EXTRACTION Bilateral     Family History  Problem Relation Age of Onset   Hypertension Mother    Hyperlipidemia Mother    Diabetes Mother  Hypertension Father    Cancer Other    Stroke Other    Heart disease Other    Hypertension Brother     Social History   Socioeconomic History   Marital status: Married    Spouse name: Not on file   Number of children: Not on file   Years of education: Not on file   Highest education level: Not on file  Occupational History   Not on file  Tobacco Use   Smoking status: Never   Smokeless tobacco: Never  Substance and Sexual Activity   Alcohol use: Yes    Comment: occas    Drug use: Never   Sexual activity: Not on file  Other Topics Concern   Not on file  Social History Narrative   Not on file   Social Determinants of Health   Financial Resource Strain: Not on file  Food Insecurity: Not on file  Transportation Needs: Not on file  Physical Activity: Not on file  Stress: Not on file  Social Connections: Not on file  Intimate Partner Violence: Not on file    Objective    BP (!) 198/112   Pulse 97   Ht 5\' 4"  (1.626 m)   Wt 161 lb 12.8 oz (73.4 kg)   SpO2 100%   BMI 27.77 kg/m   Physical Exam  41 year old female in no acute distress Cardiovascular exam with regular rate and rhythm, no murmur appreciated Lungs clear to auscultation bilaterally  Assessment & Plan:   Problem List Items Addressed This Visit       Cardiovascular and Mediastinum   Primary hypertension    Blood pressure notably elevated in office today She previously was taking triamterene-hydrochlorothiazide, did discuss that alternative medication regimens may be preferred and she would be interested in trying alternative medications as she felt that prior pharmacotherapy was inadequate Discussed options, will proceed with amlodipine, discussed potential side effects Given her blood pressure, she may require further pharmacotherapy, discussed that ideally being on lower dosages of multiple medications in regards to blood pressure management is preferred as it can help to reduce risk of potential side effects with these medications Recommend intermittent monitoring, DASH diet, regular aerobic exercise We will plan for close follow-up in about 2 weeks to monitor blood pressure Check labs as below prior to next appointment       Relevant Medications   amLODipine (NORVASC) 5 MG tablet   Other Relevant Orders   CBC with Differential/Platelet   Hemoglobin A1c   Comprehensive metabolic panel   TSH Rfx on Abnormal to Free T4   Lipid panel     Other   Vitamin D deficiency     Diagnosed in the past, was being prescribed once weekly dosing of vitamin D at 50,000 units Using any supplementation currently, no recent check of vitamin D levels We will proceed with vitamin D assessment prior to next appointment       Relevant Orders   VITAMIN D 25 Hydroxy (Vit-D Deficiency, Fractures)   Low testosterone    Reports being diagnosed with this in the past and discussion of initiating treatment She has some interest in pursuing treatment if possible Plan to check testosterone levels with upcoming labs prior to next appointment       Relevant Orders   Testosterone,Free and Total   Will request records from prior PCP to review  Return in about 2 weeks (around 05/16/2022) for HTN, review labs.   Kenner Lewan J De 05/18/2022, MD

## 2022-05-02 NOTE — Assessment & Plan Note (Signed)
Reports being diagnosed with this in the past and discussion of initiating treatment She has some interest in pursuing treatment if possible Plan to check testosterone levels with upcoming labs prior to next appointment

## 2022-05-02 NOTE — Assessment & Plan Note (Signed)
Diagnosed in the past, was being prescribed once weekly dosing of vitamin D at 50,000 units Using any supplementation currently, no recent check of vitamin D levels We will proceed with vitamin D assessment prior to next appointment

## 2022-05-02 NOTE — Assessment & Plan Note (Addendum)
Blood pressure notably elevated in office today She previously was taking triamterene-hydrochlorothiazide, did discuss that alternative medication regimens may be preferred and she would be interested in trying alternative medications as she felt that prior pharmacotherapy was inadequate Discussed options, will proceed with amlodipine, discussed potential side effects Given her blood pressure, she may require further pharmacotherapy, discussed that ideally being on lower dosages of multiple medications in regards to blood pressure management is preferred as it can help to reduce risk of potential side effects with these medications Recommend intermittent monitoring, DASH diet, regular aerobic exercise We will plan for close follow-up in about 2 weeks to monitor blood pressure Check labs as below prior to next appointment

## 2022-05-10 ENCOUNTER — Ambulatory Visit (HOSPITAL_BASED_OUTPATIENT_CLINIC_OR_DEPARTMENT_OTHER): Payer: BC Managed Care – PPO

## 2022-05-10 DIAGNOSIS — E559 Vitamin D deficiency, unspecified: Secondary | ICD-10-CM

## 2022-05-10 DIAGNOSIS — R7989 Other specified abnormal findings of blood chemistry: Secondary | ICD-10-CM | POA: Diagnosis not present

## 2022-05-10 DIAGNOSIS — I10 Essential (primary) hypertension: Secondary | ICD-10-CM

## 2022-05-12 LAB — CBC WITH DIFFERENTIAL/PLATELET
Basophils Absolute: 0 10*3/uL (ref 0.0–0.2)
Basos: 0 %
EOS (ABSOLUTE): 0.2 10*3/uL (ref 0.0–0.4)
Eos: 2 %
Hematocrit: 43.4 % (ref 34.0–46.6)
Hemoglobin: 14.6 g/dL (ref 11.1–15.9)
Immature Grans (Abs): 0 10*3/uL (ref 0.0–0.1)
Immature Granulocytes: 0 %
Lymphocytes Absolute: 2.3 10*3/uL (ref 0.7–3.1)
Lymphs: 29 %
MCH: 28.1 pg (ref 26.6–33.0)
MCHC: 33.6 g/dL (ref 31.5–35.7)
MCV: 84 fL (ref 79–97)
Monocytes Absolute: 0.4 10*3/uL (ref 0.1–0.9)
Monocytes: 5 %
Neutrophils Absolute: 4.9 10*3/uL (ref 1.4–7.0)
Neutrophils: 64 %
Platelets: 353 10*3/uL (ref 150–450)
RBC: 5.19 x10E6/uL (ref 3.77–5.28)
RDW: 13.1 % (ref 11.7–15.4)
WBC: 7.8 10*3/uL (ref 3.4–10.8)

## 2022-05-12 LAB — LIPID PANEL
Chol/HDL Ratio: 3.4 ratio (ref 0.0–4.4)
Cholesterol, Total: 196 mg/dL (ref 100–199)
HDL: 58 mg/dL (ref >39)
LDL Chol Calc (NIH): 120 mg/dL — ABNORMAL HIGH (ref 0–99)
Triglycerides: 103 mg/dL (ref 0–149)
VLDL Cholesterol Cal: 18 mg/dL (ref 5–40)

## 2022-05-12 LAB — COMPREHENSIVE METABOLIC PANEL
ALT: 23 IU/L (ref 0–32)
AST: 22 IU/L (ref 0–40)
Albumin/Globulin Ratio: 1.4 (ref 1.2–2.2)
Albumin: 4.6 g/dL (ref 3.8–4.8)
Alkaline Phosphatase: 109 IU/L (ref 44–121)
BUN/Creatinine Ratio: 11 (ref 9–23)
BUN: 8 mg/dL (ref 6–24)
Bilirubin Total: 0.7 mg/dL (ref 0.0–1.2)
CO2: 26 mmol/L (ref 20–29)
Calcium: 9.9 mg/dL (ref 8.7–10.2)
Chloride: 100 mmol/L (ref 96–106)
Creatinine, Ser: 0.7 mg/dL (ref 0.57–1.00)
Globulin, Total: 3.2 g/dL (ref 1.5–4.5)
Glucose: 84 mg/dL (ref 70–99)
Potassium: 4.7 mmol/L (ref 3.5–5.2)
Sodium: 138 mmol/L (ref 134–144)
Total Protein: 7.8 g/dL (ref 6.0–8.5)
eGFR: 110 mL/min/{1.73_m2} (ref >59)

## 2022-05-12 LAB — TESTOSTERONE,FREE AND TOTAL
Testosterone, Free: 2.8 pg/mL (ref 0.0–4.2)
Testosterone: 24 ng/dL (ref 4–50)

## 2022-05-12 LAB — HEMOGLOBIN A1C
Est. average glucose Bld gHb Est-mCnc: 105 mg/dL
Hgb A1c MFr Bld: 5.3 % (ref 4.8–5.6)

## 2022-05-12 LAB — TSH RFX ON ABNORMAL TO FREE T4: TSH: 0.774 u[IU]/mL (ref 0.450–4.500)

## 2022-05-12 LAB — VITAMIN D 25 HYDROXY (VIT D DEFICIENCY, FRACTURES): Vit D, 25-Hydroxy: 31 ng/mL (ref 30.0–100.0)

## 2022-05-17 ENCOUNTER — Encounter (HOSPITAL_BASED_OUTPATIENT_CLINIC_OR_DEPARTMENT_OTHER): Payer: Self-pay | Admitting: Family Medicine

## 2022-05-17 ENCOUNTER — Ambulatory Visit (INDEPENDENT_AMBULATORY_CARE_PROVIDER_SITE_OTHER): Payer: BC Managed Care – PPO | Admitting: Family Medicine

## 2022-05-17 DIAGNOSIS — F32A Depression, unspecified: Secondary | ICD-10-CM | POA: Diagnosis not present

## 2022-05-17 DIAGNOSIS — I10 Essential (primary) hypertension: Secondary | ICD-10-CM | POA: Diagnosis not present

## 2022-05-17 DIAGNOSIS — F419 Anxiety disorder, unspecified: Secondary | ICD-10-CM

## 2022-05-17 MED ORDER — AMLODIPINE BESYLATE 5 MG PO TABS: 5.0000 mg | ORAL_TABLET | Freq: Every day | ORAL | 1 refills | Status: DC

## 2022-05-17 MED ORDER — SERTRALINE HCL 50 MG PO TABS: 50.0000 mg | ORAL_TABLET | Freq: Every day | ORAL | 1 refills | Status: DC

## 2022-05-17 NOTE — Assessment & Plan Note (Signed)
She reports history of anxiety and depression, was being treated with sertraline in the past, has been off of this medication for least 1 year.  She has interest in resuming this medication and she feels that it helped control symptoms and she has been noticing more impact from these recently.  She has not had any counseling in the past but would be interested in this.  She does indicate that some possible past trauma and other issues that she thinks she would benefit from discussing with a specialist regarding.  Discussed options and she would like referral to Dr. Bosie Clos in our office, referral placed today We will also resume sertraline at 50 mg dose.  Discussed potential side effects Plan for follow-up in about 1 month to monitor progress with medication

## 2022-05-17 NOTE — Assessment & Plan Note (Addendum)
Patient reports that she has been taking amlodipine 5 mg daily.  Reports that she has also been checking her blood pressure intermittently, recent readings over last few days have reportedly been better controlled with systolic ranging from the 130s to 150s and diastolic in the 80s and 90s.  She reports that today she did not take her blood pressure medication until shortly before our appointment.  Usually she takes this medication around 5 to 6 AM.  No current issues related to chest pain or headaches.  Generally feels that she has been tolerating medication well.  Did have some slight dizziness a day or 2 ago but this has not been significant. Blood pressure is elevated on initial reading today, recheck does show improvement here today Discussed options with patient, we will continue with amlodipine 5 mg, intermittent monitoring at home, she will bring her blood pressure cuff to next appointment to compare to our machine in the office Recommend DASH diet, regular aerobic exercise

## 2022-05-17 NOTE — Progress Notes (Signed)
    Procedures performed today:    None.  Independent interpretation of notes and tests performed by another provider:   None.  Brief History, Exam, Impression, and Recommendations:    BP (!) 146/103   Pulse (!) 110   Ht 5\' 4"  (1.626 m)   Wt 156 lb 12.8 oz (71.1 kg)   SpO2 98%   BMI 26.91 kg/m   Primary hypertension Patient reports that she has been taking amlodipine 5 mg daily.  Reports that she has also been checking her blood pressure intermittently, recent readings over last few days have reportedly been better controlled with systolic ranging from the 130s to 150s and diastolic in the 80s and 90s.  She reports that today she did not take her blood pressure medication until shortly before our appointment.  Usually she takes this medication around 5 to 6 AM.  No current issues related to chest pain or headaches.  Generally feels that she has been tolerating medication well.  Did have some slight dizziness a day or 2 ago but this has not been significant. Blood pressure is elevated on initial reading today, recheck does show improvement here today Discussed options with patient, we will continue with amlodipine 5 mg, intermittent monitoring at home, she will bring her blood pressure cuff to next appointment to compare to our machine in the office Recommend DASH diet, regular aerobic exercise  Anxiety and depression She reports history of anxiety and depression, was being treated with sertraline in the past, has been off of this medication for least 1 year.  She has interest in resuming this medication and she feels that it helped control symptoms and she has been noticing more impact from these recently.  She has not had any counseling in the past but would be interested in this.  She does indicate that some possible past trauma and other issues that she thinks she would benefit from discussing with a specialist regarding.  Discussed options and she would like referral to Dr. in  our office, referral placed today We will also resume sertraline at 50 mg dose.  Discussed potential side effects Plan for follow-up in about 1 month to monitor progress with medication  Return in about 4 weeks (around 06/14/2022) for HTN, Anxiety.   ___________________________________________ Mary Yang de 08/15/2022, MD, ABFM, CAQSM Primary Care and Sports Medicine [facility]

## 2022-06-14 ENCOUNTER — Ambulatory Visit (INDEPENDENT_AMBULATORY_CARE_PROVIDER_SITE_OTHER): Payer: BC Managed Care – PPO | Admitting: Family Medicine

## 2022-06-14 ENCOUNTER — Encounter (HOSPITAL_BASED_OUTPATIENT_CLINIC_OR_DEPARTMENT_OTHER): Payer: Self-pay | Admitting: Family Medicine

## 2022-06-14 VITALS — BP 145/112 | HR 111 | Ht 64.0 in | Wt 152.0 lb

## 2022-06-14 DIAGNOSIS — I10 Essential (primary) hypertension: Secondary | ICD-10-CM | POA: Diagnosis not present

## 2022-06-14 DIAGNOSIS — R0789 Other chest pain: Secondary | ICD-10-CM | POA: Diagnosis not present

## 2022-06-14 MED ORDER — ALBUTEROL SULFATE HFA 108 (90 BASE) MCG/ACT IN AERS: 1.0000 | INHALATION_SPRAY | Freq: Four times a day (QID) | RESPIRATORY_TRACT | 1 refills | Status: DC | PRN

## 2022-06-14 MED ORDER — LISINOPRIL 5 MG PO TABS: 5.0000 mg | ORAL_TABLET | Freq: Every day | ORAL | 1 refills | Status: DC

## 2022-06-14 NOTE — Progress Notes (Signed)
    Procedures performed today:    None.  Independent interpretation of notes and tests performed by another provider:   None.  Brief History, Exam, Impression, and Recommendations:    BP (!) 145/112   Pulse (!) 111   Ht 5\' 4"  (1.626 m)   Wt 152 lb (68.9 kg)   SpO2 99%   BMI 26.09 kg/m   Primary hypertension Patient continues with amlodipine 5 mg once daily.  Also been checking her blood pressure at home and reports that her systolic has been adequately controlled with readings typically in the 130s or 140s.  She indicates that diastolic reading continues to be somewhat elevated in the 90s or 100s. She has had a couple episodes of chest tightness recently with some associated lightheadedness, dizziness. Also indicates some diaphoresis. Initial episode about 1 week ago. Felt tremulous as well. Did not seek medical care at that time. Today, BP is elevated in office. Lungs clear to auscultation bilaterally, heart with regular rhythm, slightly tachycardic rate, no murmur appreciated. Given continued elevation in blood pressure, will make slight adjustment to medications - discussed options, will proceed with addition of lisinopril 5 mg to current amlodipine.  Recommend intermittent monitoring at home, DASH diet Will also refer to cardiology for further evaluation given recent episodes of chest tightness and associated symptoms.  Chest tightness Recent episodes of chest tightness - discussed above. We will proceed with evaluation with cardiology - referral placed today  Spent 30 minutes on this patient encounter, including preparation, chart review, face-to-face counseling with patient and coordination of care, and documentation of encounter  Return in about 4 weeks (around 07/12/2022) for HTN.   ___________________________________________ Karell Tukes de 09/11/2022, MD, ABFM, CAQSM Primary Care and Sports Medicine [facility]

## 2022-06-14 NOTE — Assessment & Plan Note (Addendum)
Patient continues with amlodipine 5 mg once daily.  Also been checking her blood pressure at home and reports that her systolic has been adequately controlled with readings typically in the 130s or 140s.  She indicates that diastolic reading continues to be somewhat elevated in the 90s or 100s. She has had a couple episodes of chest tightness recently with some associated lightheadedness, dizziness. Also indicates some diaphoresis. Initial episode about 1 week ago. Felt tremulous as well. Did not seek medical care at that time. Today, BP is elevated in office. Lungs clear to auscultation bilaterally, heart with regular rhythm, slightly tachycardic rate, no murmur appreciated. Given continued elevation in blood pressure, will make slight adjustment to medications - discussed options, will proceed with addition of lisinopril 5 mg to current amlodipine.  Recommend intermittent monitoring at home, DASH diet Will also refer to cardiology for further evaluation given recent episodes of chest tightness and associated symptoms.

## 2022-06-14 NOTE — Assessment & Plan Note (Signed)
Recent episodes of chest tightness - discussed above. We will proceed with evaluation with cardiology - referral placed today

## 2022-07-07 ENCOUNTER — Other Ambulatory Visit (HOSPITAL_BASED_OUTPATIENT_CLINIC_OR_DEPARTMENT_OTHER): Payer: Self-pay | Admitting: Family Medicine

## 2022-07-07 DIAGNOSIS — I10 Essential (primary) hypertension: Secondary | ICD-10-CM

## 2022-07-17 ENCOUNTER — Encounter (HOSPITAL_BASED_OUTPATIENT_CLINIC_OR_DEPARTMENT_OTHER): Payer: Self-pay | Admitting: Cardiology

## 2022-07-17 ENCOUNTER — Ambulatory Visit (INDEPENDENT_AMBULATORY_CARE_PROVIDER_SITE_OTHER): Payer: BC Managed Care – PPO | Admitting: Cardiology

## 2022-07-17 VITALS — BP 150/96 | HR 95 | Ht 64.0 in | Wt 151.0 lb

## 2022-07-17 DIAGNOSIS — Z8249 Family history of ischemic heart disease and other diseases of the circulatory system: Secondary | ICD-10-CM

## 2022-07-17 DIAGNOSIS — Z7189 Other specified counseling: Secondary | ICD-10-CM | POA: Diagnosis not present

## 2022-07-17 DIAGNOSIS — R0789 Other chest pain: Secondary | ICD-10-CM | POA: Diagnosis not present

## 2022-07-17 DIAGNOSIS — I10 Essential (primary) hypertension: Secondary | ICD-10-CM

## 2022-07-17 DIAGNOSIS — R55 Syncope and collapse: Secondary | ICD-10-CM

## 2022-07-17 NOTE — Progress Notes (Signed)
Cardiology Office Note:    Date:  07/17/2022   ID:  Aya Hidaya, Daniel 1980/12/21, MRN [id number]  PCP:  de [place], Buren Kos, MD  Cardiologist:  Jodelle Red, MD  Referring MD: de [place], Buren Kos, MD   Chief Complaint  Patient presents with   Chest Pain    History of Present Illness:    Mary Yang is a 41 y.o. female with a hx of gestational hypertension, hyperlipidemia, and asthma, who is seen as a new consult at the request of de Peru, Buren Kos, MD for the evaluation and management of hypertension and chest tightness.  Previously followed by Dr. Tenny Craw in cardiology for chest pressure, last seen by her in 12/28/2014 where she was generally doing well.  She was seen by her PCP on 06/14/2022 and reported several episodes of chest tightness with associated lightheadedness and dizziness. She noted blood pressures of 130-140's/90-100's at home on amlodipine 5 mg. Her blood pressure was also elevated in clinic at 145/112. Lisinopril 5 mg was added, and she was referred to cardiology for further evaluation of chest tightness.  Shortly before her visit with Dr. De Peru, she had a presyncopal episode that included diaphoresis, and lightheadedness. She crouched down and developed chest tightness, shortness of breath, dizziness, and nausea. She made sure to lie down to prevent falling.  Cardiovascular risk factors: Prior clinical ASCVD: None Comorbid conditions: Gestational hypertension - With her first pregnancy around 41 yo. Then 2 years later with her second pregnancy it lasted a few months afterwards. Her hypertension remained permanently after her third and most recent pregnancy. She endorses a little bit of white coat syndrome. Typical readings at home are consistent around 128-30/89-90. She believes the lisinopril has helped somewhat. Family history: Her maternal grandmother died of a stroke. Her maternal grandparents had hypertension and diabetes. Her paternal grandparents had  hypertension. Her father has hypertension. Exercise level: Ever since her presyncopal episode she has felt fatigued and noticed lingering redness in her right eye for one week afterwards. She also feels weaker in general, not having the strength she used to. In the past few months she has lost 35 lbs, and notes that most activities are physically exhausting. Usually she exercises regularly, but does not exercise out in the high humidity. At work she completes 13,000 steps daily. Current diet: Not much caffeine, 1 matcha every week. She now avoids sweet tea and sodas completely. Also does not consume any energy drinks as she develops rapid palpitations. She is very conscientious of her salt intake.  She also complains of occasional random bruising throughout her body.   At baseline, she does not sleep well at night. Frequently tosses and turns.   Past Medical History:  Diagnosis Date   Asthma    Dizziness    Gestational hypertension    Hypercholesteremia    Lightheadedness    Near syncope    Palpitations    Vitamin D deficiency     Past Surgical History:  Procedure Laterality Date   arm surgery Right    WISDOM TOOTH EXTRACTION Bilateral     Current Medications: Current Outpatient Medications on File Prior to Visit  Medication Sig   albuterol (VENTOLIN HFA) 108 (90 Base) MCG/ACT inhaler Inhale 1 puff into the lungs every 6 (six) hours as needed for wheezing or shortness of breath.   amLODipine (NORVASC) 5 MG tablet Take 1 tablet (5 mg total) by mouth daily.   lisinopril (ZESTRIL) 5 MG tablet TAKE 1 TABLET (5 MG  TOTAL) BY MOUTH DAILY.   sertraline (ZOLOFT) 50 MG tablet Take 1 tablet (50 mg total) by mouth daily.   No current facility-administered medications on file prior to visit.     Allergies:   Penicillins   Social History   Tobacco Use   Smoking status: Never   Smokeless tobacco: Never  Substance Use Topics   Alcohol use: Yes    Comment: occas   Drug use: Never     Family History: family history includes Diabetes in her mother; Hyperlipidemia in her mother; Hypertension in her brother, father, maternal grandfather, maternal grandmother, mother, paternal grandfather, and paternal grandmother; Stroke in her maternal grandmother.  ROS:   Please see the history of present illness.  Additional pertinent ROS: Constitutional: Negative for chills, fever, night sweats, unintentional weight loss. Positive for fatigue.  HENT: Negative for ear pain and hearing loss.   Eyes: Negative for loss of vision and eye pain.  Respiratory: Negative for cough, sputum, wheezing.   Cardiovascular: See HPI. Gastrointestinal: Negative for abdominal pain, melena, and hematochezia.  Genitourinary: Negative for dysuria and hematuria.  Musculoskeletal: Negative for falls and myalgias. Positive for generalized weakness. Skin: Negative for itching and rash.  Neurological: Negative for focal weakness, focal sensory changes and loss of consciousness.  Endo/Heme/Allergies: Does not bleed easily. Positive for bruising.     EKGs/Labs/Other Studies Reviewed:    The following studies were reviewed today:  2D Echo  04/24/2014: Notes Recorded by Pricilla Riffle, MD on 04/24/2014 at 4:16 PM Echo is normal.   EKG:  EKG is personally reviewed.   07/17/2022:  NSR at 95 bpm  Recent Labs: 05/10/2022: ALT 23; BUN 8; Creatinine, Ser 0.70; Hemoglobin 14.6; Platelets 353; Potassium 4.7; Sodium 138; TSH 0.774   Recent Lipid Panel    Component Value Date/Time   CHOL 196 05/10/2022 0832   TRIG 103 05/10/2022 0832   HDL 58 05/10/2022 0832   CHOLHDL 3.4 05/10/2022 0832   CHOLHDL 4 06/16/2010 0000   VLDL 18.6 06/16/2010 0000   LDLCALC 120 (H) 05/10/2022 0832   LDLDIRECT 163.7 06/16/2010 0000    Physical Exam:    VS:  BP (!) 150/96 (BP Location: Right Arm, Patient Position: Sitting, Cuff Size: Normal)   Pulse 95   Ht 5\' 4"  (1.626 m)   Wt 151 lb (68.5 kg)   SpO2 98%   BMI 25.92 kg/m      Wt Readings from Last 3 Encounters:  07/17/22 151 lb (68.5 kg)  06/14/22 152 lb (68.9 kg)  05/17/22 156 lb 12.8 oz (71.1 kg)    GEN: Well nourished, well developed in no acute distress HEENT: Normal, moist mucous membranes NECK: No JVD CARDIAC: regular rhythm, normal S1 and S2, no rubs or gallops. No murmur. VASCULAR: Radial and DP pulses 2+ bilaterally. No carotid bruits RESPIRATORY:  Clear to auscultation without rales, wheezing or rhonchi  ABDOMEN: Soft, non-tender, non-distended MUSCULOSKELETAL:  Ambulates independently SKIN: Warm and dry, no edema NEUROLOGIC:  Alert and oriented x 3. No focal neuro deficits noted. PSYCHIATRIC:  Normal affect    ASSESSMENT:    1. Primary hypertension   2. Chest tightness   3. Family history of heart disease   4. Cardiac risk counseling   5. Counseling on health promotion and disease prevention   6. Syncope, near    PLAN:    Hypertension -has close follow up scheduled for this -BP remains elevated on recheck -she is currently on amlodipine 5 mg daily and lisinopril 5  mg daily -she reports home blood pressures are much better controlled -recommended bringing BP log to upcoming PCP visit. If not at goal, she has room on both the amlodipine and lisinopril to gradually increase  Chest tightness Near syncope Family history of heart disease -We spent significant time today reviewing different parts of the cardiovascular system (electrical, vascular, functional, and valvular). We discussed how each of these systems can present with different symptoms. We also discussed that there are noncardiac causes as well -we discussed options for further evaluation of symptoms today. After shared decision making, given her low risk and atypical symptoms, we will not pursue additional testing at this time -she will contact me if symptoms worsen -reviewed red flag warning signs that need immediate medical attention  Cardiac risk counseling and prevention  recommendations: -recommend heart healthy/Mediterranean diet and recommendations for activity -recommend avoidance of tobacco products. Avoid excess alcohol. -ASCVD risk score: The 10-year ASCVD risk score (Arnett DK, et al., 2019) is: 1.1%   Values used to calculate the score:     Age: 73 years     Sex: Female     Is Non-Hispanic African American: No     Diabetic: No     Tobacco smoker: No     Systolic Blood Pressure: 150 mmHg     Is BP treated: Yes     HDL Cholesterol: 58 mg/dL     Total Cholesterol: 196 mg/dL    Plan for follow up:  PRN.  Jodelle Red, MD, PhD, [facility] [place]  [facility]    Medication Adjustments/Labs and Tests Ordered: Current medicines are reviewed at length with the patient today.  Concerns regarding medicines are outlined above.   Orders Placed This Encounter  Procedures   EKG 12-Lead   No orders of the defined types were placed in this encounter.  Patient Instructions  Medication Instructions:  Your Physician recommend you continue on your current medication as directed.    *If you need a refill on your cardiac medications before your next appointment, please call your pharmacy*   Lab Work: None ordered today   Testing/Procedures: None ordered today   Follow-Up: At [facility], you and your health needs are our priority.  As part of our continuing mission to provide you with exceptional heart care, we have created designated Provider Care Teams.  These Care Teams include your primary Cardiologist (physician) and Advanced Practice Providers (APPs -  Physician Assistants and Nurse Practitioners) who all work together to provide you with the care you need, when you need it.  We recommend signing up for the patient portal called "MyChart".  Sign up information is provided on this After Visit Summary.  MyChart is used to connect with patients for Virtual Visits (Telemedicine).  Patients are able to view lab/test results, encounter  notes, upcoming appointments, etc.  Non-urgent messages can be sent to your provider as well.   To learn more about what you can do with MyChart, go to [email or web address].    Your next appointment:   As needed  The format for your next appointment:   In Person  Provider:   Jodelle Red, MD{          I,Mathew Stumpf,acting as a scribe for Jodelle Red, MD.,have documented all relevant documentation on the behalf of Jodelle Red, MD,as directed by  Jodelle Red, MD while in the presence of Jodelle Red, MD.  I, Jodelle Red, MD, have reviewed all documentation for this visit. The documentation on 07/17/22 for  the exam, diagnosis, procedures, and orders are all accurate and complete.   Signed, Jodelle Red, MD PhD 07/17/2022 8:29 PM    [facility]

## 2022-07-17 NOTE — Patient Instructions (Signed)

## 2022-07-31 ENCOUNTER — Ambulatory Visit (INDEPENDENT_AMBULATORY_CARE_PROVIDER_SITE_OTHER): Payer: BC Managed Care – PPO | Admitting: Family Medicine

## 2022-07-31 ENCOUNTER — Encounter (HOSPITAL_BASED_OUTPATIENT_CLINIC_OR_DEPARTMENT_OTHER): Payer: Self-pay | Admitting: Family Medicine

## 2022-07-31 VITALS — BP 129/87 | HR 76 | Ht 63.0 in | Wt 150.0 lb

## 2022-07-31 DIAGNOSIS — Z0000 Encounter for general adult medical examination without abnormal findings: Secondary | ICD-10-CM

## 2022-07-31 DIAGNOSIS — I10 Essential (primary) hypertension: Secondary | ICD-10-CM

## 2022-07-31 DIAGNOSIS — Z7689 Persons encountering health services in other specified circumstances: Secondary | ICD-10-CM | POA: Diagnosis not present

## 2022-07-31 NOTE — Assessment & Plan Note (Signed)
Blood pressure better controlled in office today.  She also reports that she has been checking at home and home blood pressure readings have also been improved.  Due to this, she has been checking this frequently She continues with amlodipine 5 mg and lisinopril 5 mg daily and indicates that she has been tolerating this well, denies any issues with either medication She did have evaluation with cardiology related to blood pressure as well as intermittent chest tightness/presyncopal episode.  Following their evaluation, they did not have any specific concerns or feel that additional advanced testing was needed at that time.  They recommended returning to the office as needed should she have any further symptoms or progressive symptoms At this time, can continue with current medication regimen, lifestyle modifications Recommend continuing with intermittent monitoring of blood pressure at home

## 2022-07-31 NOTE — Progress Notes (Signed)
    Procedures performed today:    None.  Independent interpretation of notes and tests performed by another provider:   None.  Brief History, Exam, Impression, and Recommendations:    BP 129/87   Pulse 76   Ht 5\' 3"  (1.6 m)   Wt 150 lb (68 kg)   SpO2 99%   BMI 26.57 kg/m   Primary hypertension Blood pressure better controlled in office today.  She also reports that she has been checking at home and home blood pressure readings have also been improved.  Due to this, she has been checking this frequently She continues with amlodipine 5 mg and lisinopril 5 mg daily and indicates that she has been tolerating this well, denies any issues with either medication She did have evaluation with cardiology related to blood pressure as well as intermittent chest tightness/presyncopal episode.  Following their evaluation, they did not have any specific concerns or feel that additional advanced testing was needed at that time.  They recommended returning to the office as needed should she have any further symptoms or progressive symptoms At this time, can continue with current medication regimen, lifestyle modifications Recommend continuing with intermittent monitoring of blood pressure at home  Return in about 6 months (around 01/31/2023) for CPE with [facility]W a few days prior.   ___________________________________________ Lindalou Soltis de 02/02/2023, MD, ABFM, CAQSM Primary Care and Sports Medicine [facility]

## 2022-07-31 NOTE — Patient Instructions (Signed)
  Medication Instructions:  Your physician recommends that you continue on your current medications as directed. Please refer to the Current Medication list given to you today. --If you need a refill on any your medications before your next appointment, please call your pharmacy first. If no refills are authorized on file call the office.-- Lab Work: Your physician has recommended that you have lab work today: No If you have labs (blood work) drawn today and your tests are completely normal, you will receive your results via MyChart message OR a phone call from our staff.  Please ensure you check your voicemail in the event that you authorized detailed messages to be left on a delegated number. If you have any lab test that is abnormal or we need to change your treatment, we will call you to review the results.  Referrals/Procedures/Imaging: No  Follow-Up: Your next appointment:   Your physician recommends that you schedule a follow-up appointment in: 6 months follow-up with Dr. de [place].  You will receive a text message or e-mail with a link to a survey about your care and experience with us today! We would greatly appreciate your feedback!   Thanks for letting us be apart of your health journey!!  Primary Care and Sports Medicine   Dr. Raymond de Cuba   We encourage you to activate your patient portal called "MyChart".  Sign up information is provided on this After Visit Summary.  MyChart is used to connect with patients for Virtual Visits (Telemedicine).  Patients are able to view lab/test results, encounter notes, upcoming appointments, etc.  Non-urgent messages can be sent to your provider as well. To learn more about what you can do with MyChart, please visit --  [email or web address].    

## 2022-08-01 ENCOUNTER — Other Ambulatory Visit (HOSPITAL_BASED_OUTPATIENT_CLINIC_OR_DEPARTMENT_OTHER): Payer: Self-pay | Admitting: Family Medicine

## 2022-08-01 DIAGNOSIS — I10 Essential (primary) hypertension: Secondary | ICD-10-CM

## 2022-08-19 ENCOUNTER — Other Ambulatory Visit (HOSPITAL_BASED_OUTPATIENT_CLINIC_OR_DEPARTMENT_OTHER): Payer: Self-pay | Admitting: Family Medicine

## 2022-08-19 DIAGNOSIS — F32A Depression, unspecified: Secondary | ICD-10-CM

## 2022-08-30 ENCOUNTER — Other Ambulatory Visit (HOSPITAL_BASED_OUTPATIENT_CLINIC_OR_DEPARTMENT_OTHER): Payer: Self-pay | Admitting: Family Medicine

## 2022-08-30 DIAGNOSIS — I10 Essential (primary) hypertension: Secondary | ICD-10-CM

## 2022-10-04 ENCOUNTER — Other Ambulatory Visit (HOSPITAL_BASED_OUTPATIENT_CLINIC_OR_DEPARTMENT_OTHER): Payer: Self-pay

## 2022-10-04 DIAGNOSIS — I10 Essential (primary) hypertension: Secondary | ICD-10-CM

## 2022-10-04 MED ORDER — LISINOPRIL 5 MG PO TABS: 5.0000 mg | ORAL_TABLET | Freq: Every day | ORAL | 1 refills | Status: DC

## 2022-10-26 ENCOUNTER — Encounter (HOSPITAL_BASED_OUTPATIENT_CLINIC_OR_DEPARTMENT_OTHER): Payer: Self-pay

## 2022-10-26 ENCOUNTER — Other Ambulatory Visit: Payer: Self-pay

## 2022-10-26 ENCOUNTER — Emergency Department (HOSPITAL_BASED_OUTPATIENT_CLINIC_OR_DEPARTMENT_OTHER)
Admission: EM | Admit: 2022-10-26 | Discharge: 2022-10-26 | Disposition: A | Discharge status: Home / Self Care | Payer: BC Managed Care – PPO | Attending: Emergency Medicine | Admitting: Emergency Medicine

## 2022-10-26 DIAGNOSIS — D72829 Elevated white blood cell count, unspecified: Secondary | ICD-10-CM | POA: Diagnosis not present

## 2022-10-26 DIAGNOSIS — R1012 Left upper quadrant pain: Secondary | ICD-10-CM | POA: Insufficient documentation

## 2022-10-26 DIAGNOSIS — R197 Diarrhea, unspecified: Secondary | ICD-10-CM | POA: Insufficient documentation

## 2022-10-26 DIAGNOSIS — R000 Tachycardia, unspecified: Secondary | ICD-10-CM | POA: Diagnosis not present

## 2022-10-26 DIAGNOSIS — Z79899 Other long term (current) drug therapy: Secondary | ICD-10-CM | POA: Insufficient documentation

## 2022-10-26 DIAGNOSIS — I10 Essential (primary) hypertension: Secondary | ICD-10-CM | POA: Insufficient documentation

## 2022-10-26 DIAGNOSIS — J45909 Unspecified asthma, uncomplicated: Secondary | ICD-10-CM | POA: Diagnosis not present

## 2022-10-26 DIAGNOSIS — R0789 Other chest pain: Secondary | ICD-10-CM | POA: Diagnosis not present

## 2022-10-26 DIAGNOSIS — R112 Nausea with vomiting, unspecified: Secondary | ICD-10-CM | POA: Diagnosis not present

## 2022-10-26 LAB — URINALYSIS, ROUTINE W REFLEX MICROSCOPIC
Bilirubin Urine: NEGATIVE
Glucose, UA: NEGATIVE mg/dL
Hgb urine dipstick: NEGATIVE
Ketones, ur: NEGATIVE mg/dL
Leukocytes,Ua: NEGATIVE
Nitrite: NEGATIVE
Protein, ur: NEGATIVE mg/dL
Specific Gravity, Urine: 1.013 (ref 1.005–1.030)
pH: 7 (ref 5.0–8.0)

## 2022-10-26 LAB — LIPASE, BLOOD: Lipase: 14 U/L (ref 11–51)

## 2022-10-26 LAB — BASIC METABOLIC PANEL
Anion gap: 10 (ref 5–15)
BUN: 13 mg/dL (ref 6–20)
CO2: 25 mmol/L (ref 22–32)
Calcium: 9.7 mg/dL (ref 8.9–10.3)
Chloride: 100 mmol/L (ref 98–111)
Creatinine, Ser: 0.77 mg/dL (ref 0.44–1.00)
GFR, Estimated: >60 mL/min (ref >60)
Glucose, Bld: 148 mg/dL — ABNORMAL HIGH (ref 70–99)
Potassium: 3.9 mmol/L (ref 3.5–5.1)
Sodium: 135 mmol/L (ref 135–145)

## 2022-10-26 LAB — PREGNANCY, URINE: Preg Test, Ur: NEGATIVE

## 2022-10-26 LAB — CBC WITH DIFFERENTIAL/PLATELET
Abs Immature Granulocytes: 0.05 10*3/uL (ref 0.00–0.07)
Basophils Absolute: 0 10*3/uL (ref 0.0–0.1)
Basophils Relative: 0 %
Eosinophils Absolute: 0 10*3/uL (ref 0.0–0.5)
Eosinophils Relative: 0 %
HCT: 39.9 % (ref 36.0–46.0)
Hemoglobin: 12.9 g/dL (ref 12.0–15.0)
Immature Granulocytes: 0 %
Lymphocytes Relative: 7 %
Lymphs Abs: 0.9 10*3/uL (ref 0.7–4.0)
MCH: 27.2 pg (ref 26.0–34.0)
MCHC: 32.3 g/dL (ref 30.0–36.0)
MCV: 84 fL (ref 80.0–100.0)
Monocytes Absolute: 0.5 10*3/uL (ref 0.1–1.0)
Monocytes Relative: 3 %
Neutro Abs: 11.9 10*3/uL — ABNORMAL HIGH (ref 1.7–7.7)
Neutrophils Relative %: 90 %
Platelets: 301 10*3/uL (ref 150–400)
RBC: 4.75 MIL/uL (ref 3.87–5.11)
RDW: 13.5 % (ref 11.5–15.5)
WBC: 13.3 10*3/uL — ABNORMAL HIGH (ref 4.0–10.5)
nRBC: 0 % (ref 0.0–0.2)

## 2022-10-26 LAB — HEPATIC FUNCTION PANEL
ALT: 18 U/L (ref 0–44)
AST: 16 U/L (ref 15–41)
Albumin: 4.7 g/dL (ref 3.5–5.0)
Alkaline Phosphatase: 79 U/L (ref 38–126)
Bilirubin, Direct: 0.1 mg/dL (ref 0.0–0.2)
Indirect Bilirubin: 0.5 mg/dL (ref 0.3–0.9)
Total Bilirubin: 0.6 mg/dL (ref 0.3–1.2)
Total Protein: 8.2 g/dL — ABNORMAL HIGH (ref 6.5–8.1)

## 2022-10-26 MED ORDER — ONDANSETRON HCL 4 MG/2ML IJ SOLN
4.0000 mg | Freq: Once | INTRAMUSCULAR | Status: AC
  Administered 2022-10-26: 4 mg via INTRAVENOUS
  Filled 2022-10-26: qty 2

## 2022-10-26 MED ORDER — ONDANSETRON HCL 4 MG PO TABS: 4.0000 mg | ORAL_TABLET | Freq: Four times a day (QID) | ORAL | 0 refills | Status: DC

## 2022-10-26 MED ORDER — KETOROLAC TROMETHAMINE 15 MG/ML IJ SOLN
15.0000 mg | Freq: Once | INTRAMUSCULAR | Status: AC
  Administered 2022-10-26: 15 mg via INTRAVENOUS
  Filled 2022-10-26: qty 1

## 2022-10-26 MED ORDER — DICYCLOMINE HCL 20 MG PO TABS: 20.0000 mg | ORAL_TABLET | Freq: Two times a day (BID) | ORAL | 0 refills | Status: DC

## 2022-10-26 NOTE — ED Notes (Signed)
Pt given discharge instructions and reviewed prescriptions. Opportunities given for questions. Pt verbalizes understanding. Doneshia Hill R, RN 

## 2022-10-26 NOTE — Discharge Instructions (Signed)
You were seen in the emergency department today for nausea, vomiting and diarrhea. You likely have a viral gastroenteritis. You labs here are reassuring. I have prescribed you:  Ondansetron (Zofran) - for nausea  Dicyclomine (bentyl) - for diarrhea and abdominal cramping   Please drink plenty of fluids and rest. Please return to the emergency department for worsening abdominal pain with fever or inability to keep liquids down.

## 2022-10-26 NOTE — ED Notes (Signed)
Chest pain resolved; No c/o nausea or vomiting at this time.

## 2022-10-26 NOTE — ED Notes (Signed)
Pt given discharge instructions and reviewed prescriptions. Opportunities given for questions. Pt verbalizes understanding. Athea Haley R, RN 

## 2022-10-26 NOTE — ED Provider Notes (Signed)
[facility] EMERGENCY DEPT Provider Note   CSN: [id number] Arrival date & time: 10/26/22  4098     History  Chief Complaint  Patient presents with   Chest Pain   Emesis    Mary Yang is a 41 y.o. female. With past medical history of hypertension, asthma, hypercholesterolemia who presents to the emergency department with nausea vomiting and diarrhea.  States this morning around 1 AM she woke up to use the restroom.  She states that when she got to the toilet she felt dizzy and off balance.  She then had an episode of diarrhea.  States that when she got up to go back to bed she felt nauseated so she laid on the bathroom floor and eventually vomited.  She states that she vomited 2 more times before going back to bed.  States that afterward she began having sharp pain in her left upper quadrant under her left rib cage that radiated into the left back. She initially thought this was from heaving but states that the pain has increased and persisted.  She continues to be nauseated without further episodes of vomiting.  She denies having chest pain, shortness of breath, cough or fever.  Denies dysuria, vaginal discharge.  HPI     Home Medications Prior to Admission medications   Medication Sig Start Date End Date Taking? Authorizing Provider  dicyclomine (BENTYL) 20 MG tablet Take 1 tablet (20 mg total) by mouth 2 (two) times daily. 10/26/22  Yes Cristopher Peru, PA-C  ondansetron (ZOFRAN) 4 MG tablet Take 1 tablet (4 mg total) by mouth every 6 (six) hours. 10/26/22  Yes Cristopher Peru, PA-C  albuterol (VENTOLIN HFA) 108 (90 Base) MCG/ACT inhaler Inhale 1 puff into the lungs every 6 (six) hours as needed for wheezing or shortness of breath. 06/14/22   de Peru, Buren Kos, MD  amLODipine (NORVASC) 5 MG tablet Take 1 tablet (5 mg total) by mouth daily. 05/17/22   de Peru, Buren Kos, MD  lisinopril (ZESTRIL) 5 MG tablet Take 1 tablet (5 mg total) by mouth daily. 10/04/22   de Peru,  Raymond J, MD  sertraline (ZOLOFT) 50 MG tablet TAKE 1 TABLET BY MOUTH EVERY DAY 08/21/22   de [place], Buren Kos, MD      Allergies    Penicillins    Review of Systems   Review of Systems  Respiratory:  Negative for shortness of breath.   Cardiovascular:  Positive for palpitations. Negative for chest pain.  Gastrointestinal:  Positive for diarrhea, nausea and vomiting. Negative for abdominal pain.  Genitourinary:  Positive for flank pain. Negative for dysuria.  Neurological:  Positive for dizziness.  All other systems reviewed and are negative.   Physical Exam Updated Vital Signs BP (!) 158/115   Pulse (!) 106   Temp 98 F (36.7 C) (Oral)   Resp 18   Ht 5\' 3"  (1.6 m)   Wt 70.3 kg   LMP 10/12/2022   SpO2 98%   BMI 27.46 kg/m  Physical Exam Vitals and nursing note reviewed.  Constitutional:      General: She is not in acute distress.    Appearance: Normal appearance. She is well-developed. She is ill-appearing. She is not toxic-appearing.  HENT:     Head: Normocephalic.     Mouth/Throat:     Mouth: Mucous membranes are moist.     Pharynx: Oropharynx is clear.  Eyes:     General: No scleral icterus.    Extraocular Movements: Extraocular  movements intact.  Cardiovascular:     Rate and Rhythm: Regular rhythm. Tachycardia present.     Pulses:          Radial pulses are 2+ on the right side and 2+ on the left side.     Heart sounds: Normal heart sounds. No murmur heard. Pulmonary:     Effort: Pulmonary effort is normal. No tachypnea or respiratory distress.     Breath sounds: Normal breath sounds.  Chest:    Abdominal:     General: Abdomen is protuberant. Bowel sounds are normal.     Palpations: Abdomen is soft.     Tenderness: There is abdominal tenderness in the left upper quadrant. There is left CVA tenderness. There is no guarding or rebound.  Musculoskeletal:     Cervical back: Neck supple.     Right lower leg: No edema.     Left lower leg: No edema.  Skin:     General: Skin is warm and dry.     Capillary Refill: Capillary refill takes less than 2 seconds.  Neurological:     General: No focal deficit present.     Mental Status: She is alert and oriented to person, place, and time. Mental status is at baseline.  Psychiatric:        Mood and Affect: Mood normal.        Behavior: Behavior normal.        Thought Content: Thought content normal.        Judgment: Judgment normal.     ED Results / Procedures / Treatments   Labs (all labs ordered are listed, but only abnormal results are displayed) Labs Reviewed  BASIC METABOLIC PANEL - Abnormal; Notable for the following components:      Result Value   Glucose, Bld 148 (*)    All other components within normal limits  HEPATIC FUNCTION PANEL - Abnormal; Notable for the following components:   Total Protein 8.2 (*)    All other components within normal limits  CBC WITH DIFFERENTIAL/PLATELET - Abnormal; Notable for the following components:   WBC 13.3 (*)    Neutro Abs 11.9 (*)    All other components within normal limits  URINALYSIS, ROUTINE W REFLEX MICROSCOPIC - Abnormal; Notable for the following components:   Color, Urine COLORLESS (*)    All other components within normal limits  LIPASE, BLOOD  PREGNANCY, URINE   EKG EKG Interpretation  Date/Time:  Thursday October 26 2022 06:49:53 EST Ventricular Rate:  105 PR Interval:  184 QRS Duration: 87 QT Interval:  353 QTC Calculation: 467 R Axis:   40 Text Interpretation: Sinus tachycardia Abnormal R-wave progression, early transition No acute changes No significant change since last tracing Confirmed by Derwood Kaplan [phone]) on 10/26/2022 7:41:00 AM  Radiology No results found.  Procedures Procedures   Medications Ordered in ED Medications  ondansetron (ZOFRAN) injection 4 mg (4 mg Intravenous Given 10/26/22 0648)  ketorolac (TORADOL) 15 MG/ML injection 15 mg (15 mg Intravenous Given 10/26/22 0757)    ED Course/ Medical  Decision Making/ A&P                           Medical Decision Making Amount and/or Complexity of Data Reviewed Labs: ordered.  Risk Prescription drug management.   Initial Impression and Ddx 41 year old female who presents with nausea, vomiting, diarrhea and LUQ abdominal pain that radiates to the left back. On initial assessment she is somewhat uncomfortable  appearing, hemodynamically stable and non-septic, non-toxic. She has TTP of the LUQ and mild CVAT. Tachycardic.  Differential is broad but includes pancreatitis, renal stone, obstructed stone, infected stone, gastroenteritis, PE, UTI, pyelonephritis, colitis, SBO, ACS, etc.  Patient PMH that increases complexity of ED encounter:  hypertension, hyperlipidemia  Interpretation of Diagnostics I personally reviewed the EKG and my interpretation is as follows:  sinus tachycardia    Wbc 13.3  UA negative, not pregnant Lipase negative, LFTs wnl, no electrolyte dysfunction  Patient Reassessment and Ultimate Disposition/Management On reassessment nausea is improved.   Her workup has been unremarkable. She does have increased wbc to 13.3 which may be reactive. Likely has a viral GI syndrome.  EKG without ischemic pattern. She is not having chest pain. Will not pursue troponin. Lipase is wnl so doubt pancreatitis given her LUQ abdominal pain. UA without UTI or hemoglobin so doubt stone, infected stone, obstructed stone, pyelonephritis. Symptoms inconsistent with SBO.  Considered but doubt PE. She is mildly tachycardic but no SOB and symptoms inconsistent with PE. Wells low risk will not pursue d-dimer.  No rash present concerning for zoster.  Again, likely viral GI syndrome. Passed PO trial here in ED. Mildly tachycardic but given that she is tolerating PO, will allow her to volume replete at home as she appears euvolemic here. Orthostatic vital signs are negative.  Patient was also evaluated by Dr. Rhunette Croft, ED attending who agrees with  the plan. Will discharge with bentyl, zofran. Encouraged to drink plenty of fluids and rest. Return precautions given.   Patient management required discussion with the following services or consulting groups:  None  Complexity of Problems Addressed Acute uncomplicated illness or injury with no diagnostics  Additional Data Reviewed and Analyzed Further history obtained from: Past medical history and medications listed in the EMR, Recent PCP notes, Care Everywhere, and Prior labs/imaging results  Patient Encounter Risk Assessment None  Final Clinical Impression(s) / ED Diagnoses Final diagnoses:  Nausea and vomiting, unspecified vomiting type    Rx / DC Orders ED Discharge Orders          Ordered    ondansetron (ZOFRAN) 4 MG tablet  Every 6 hours        10/26/22 0844    dicyclomine (BENTYL) 20 MG tablet  2 times daily        10/26/22 0844              Cristopher Peru, PA-C 10/26/22 4097    Derwood Kaplan, MD 10/26/22 [phone]

## 2022-10-26 NOTE — ED Triage Notes (Signed)
POV, pt sts that 1am began having NVD, approx 4 episodes of emesis. shortly after starting having left rib pain, bilateral lower back pain, and dizziness. Hx HTN. Amb to room, A&O x 4.

## 2022-11-24 ENCOUNTER — Other Ambulatory Visit (HOSPITAL_BASED_OUTPATIENT_CLINIC_OR_DEPARTMENT_OTHER): Payer: Self-pay | Admitting: Family Medicine

## 2022-11-24 DIAGNOSIS — I10 Essential (primary) hypertension: Secondary | ICD-10-CM

## 2022-12-25 ENCOUNTER — Encounter (HOSPITAL_BASED_OUTPATIENT_CLINIC_OR_DEPARTMENT_OTHER): Payer: Self-pay

## 2023-01-22 ENCOUNTER — Ambulatory Visit (HOSPITAL_BASED_OUTPATIENT_CLINIC_OR_DEPARTMENT_OTHER): Payer: BC Managed Care – PPO

## 2023-01-31 ENCOUNTER — Encounter (HOSPITAL_BASED_OUTPATIENT_CLINIC_OR_DEPARTMENT_OTHER): Payer: BC Managed Care – PPO | Admitting: Family Medicine

## 2023-02-08 ENCOUNTER — Other Ambulatory Visit (HOSPITAL_BASED_OUTPATIENT_CLINIC_OR_DEPARTMENT_OTHER): Payer: Self-pay | Admitting: Family Medicine

## 2023-02-08 DIAGNOSIS — I10 Essential (primary) hypertension: Secondary | ICD-10-CM

## 2023-02-27 ENCOUNTER — Other Ambulatory Visit (HOSPITAL_BASED_OUTPATIENT_CLINIC_OR_DEPARTMENT_OTHER): Payer: Self-pay | Admitting: Family Medicine

## 2023-02-27 DIAGNOSIS — I10 Essential (primary) hypertension: Secondary | ICD-10-CM

## 2023-05-10 ENCOUNTER — Other Ambulatory Visit (HOSPITAL_BASED_OUTPATIENT_CLINIC_OR_DEPARTMENT_OTHER): Payer: Self-pay | Admitting: Family Medicine

## 2023-05-10 DIAGNOSIS — F32A Depression, unspecified: Secondary | ICD-10-CM

## 2023-05-10 NOTE — Telephone Encounter (Signed)
Dr. de Peru, please advise on med refill request.

## 2023-06-25 ENCOUNTER — Other Ambulatory Visit (HOSPITAL_BASED_OUTPATIENT_CLINIC_OR_DEPARTMENT_OTHER): Payer: BC Managed Care – PPO

## 2023-06-25 DIAGNOSIS — Z0000 Encounter for general adult medical examination without abnormal findings: Secondary | ICD-10-CM

## 2023-06-26 LAB — CBC WITH DIFFERENTIAL/PLATELET
Basophils Absolute: 0.1 10*3/uL (ref 0.0–0.2)
Basos: 1 %
EOS (ABSOLUTE): 0.2 10*3/uL (ref 0.0–0.4)
Eos: 2 %
Hematocrit: 44.8 % (ref 34.0–46.6)
Hemoglobin: 14.1 g/dL (ref 11.1–15.9)
Immature Grans (Abs): 0.1 10*3/uL (ref 0.0–0.1)
Immature Granulocytes: 1 %
Lymphocytes Absolute: 2.5 10*3/uL (ref 0.7–3.1)
Lymphs: 22 %
MCH: 26.4 pg — ABNORMAL LOW (ref 26.6–33.0)
MCHC: 31.5 g/dL (ref 31.5–35.7)
MCV: 84 fL (ref 79–97)
Monocytes Absolute: 0.7 10*3/uL (ref 0.1–0.9)
Monocytes: 6 %
Neutrophils Absolute: 7.8 10*3/uL — ABNORMAL HIGH (ref 1.4–7.0)
Neutrophils: 68 %
Platelets: 336 10*3/uL (ref 150–450)
RBC: 5.34 x10E6/uL — ABNORMAL HIGH (ref 3.77–5.28)
RDW: 13.7 % (ref 11.7–15.4)
WBC: 11.3 10*3/uL — ABNORMAL HIGH (ref 3.4–10.8)

## 2023-06-26 LAB — HEMOGLOBIN A1C
Est. average glucose Bld gHb Est-mCnc: 114 mg/dL
Hgb A1c MFr Bld: 5.6 % (ref 4.8–5.6)

## 2023-06-26 LAB — COMPREHENSIVE METABOLIC PANEL
ALT: 18 IU/L (ref 0–32)
AST: 20 IU/L (ref 0–40)
Albumin: 4.6 g/dL (ref 3.9–4.9)
Alkaline Phosphatase: 96 IU/L (ref 44–121)
BUN/Creatinine Ratio: 17 (ref 9–23)
BUN: 13 mg/dL (ref 6–24)
Bilirubin Total: 0.6 mg/dL (ref 0.0–1.2)
CO2: 21 mmol/L (ref 20–29)
Calcium: 9.8 mg/dL (ref 8.7–10.2)
Chloride: 101 mmol/L (ref 96–106)
Creatinine, Ser: 0.78 mg/dL (ref 0.57–1.00)
Globulin, Total: 3.1 g/dL (ref 1.5–4.5)
Glucose: 84 mg/dL (ref 70–99)
Potassium: 4.6 mmol/L (ref 3.5–5.2)
Sodium: 138 mmol/L (ref 134–144)
Total Protein: 7.7 g/dL (ref 6.0–8.5)
eGFR: 96 mL/min/{1.73_m2} (ref >59)

## 2023-06-26 LAB — TSH RFX ON ABNORMAL TO FREE T4: TSH: 0.854 u[IU]/mL (ref 0.450–4.500)

## 2023-06-26 LAB — LIPID PANEL
Chol/HDL Ratio: 3.2 ratio (ref 0.0–4.4)
Cholesterol, Total: 214 mg/dL — ABNORMAL HIGH (ref 100–199)
HDL: 66 mg/dL (ref >39)
LDL Chol Calc (NIH): 129 mg/dL — ABNORMAL HIGH (ref 0–99)
Triglycerides: 109 mg/dL (ref 0–149)
VLDL Cholesterol Cal: 19 mg/dL (ref 5–40)

## 2023-07-02 ENCOUNTER — Encounter (HOSPITAL_BASED_OUTPATIENT_CLINIC_OR_DEPARTMENT_OTHER): Payer: Self-pay | Admitting: Family Medicine

## 2023-07-02 ENCOUNTER — Ambulatory Visit (INDEPENDENT_AMBULATORY_CARE_PROVIDER_SITE_OTHER): Payer: BC Managed Care – PPO | Admitting: Family Medicine

## 2023-07-02 VITALS — BP 179/98 | HR 86 | Temp 97.7°F | Ht 63.0 in | Wt 177.4 lb

## 2023-07-02 DIAGNOSIS — K529 Noninfective gastroenteritis and colitis, unspecified: Secondary | ICD-10-CM | POA: Diagnosis not present

## 2023-07-02 DIAGNOSIS — Z23 Encounter for immunization: Secondary | ICD-10-CM | POA: Diagnosis not present

## 2023-07-02 DIAGNOSIS — R32 Unspecified urinary incontinence: Secondary | ICD-10-CM | POA: Insufficient documentation

## 2023-07-02 DIAGNOSIS — Z0000 Encounter for general adult medical examination without abnormal findings: Secondary | ICD-10-CM | POA: Diagnosis not present

## 2023-07-02 MED ORDER — ALBUTEROL SULFATE HFA 108 (90 BASE) MCG/ACT IN AERS: 1.0000 | INHALATION_SPRAY | Freq: Four times a day (QID) | RESPIRATORY_TRACT | 1 refills | Status: DC | PRN

## 2023-07-02 MED ORDER — ONDANSETRON HCL 4 MG PO TABS: 4.0000 mg | ORAL_TABLET | Freq: Four times a day (QID) | ORAL | 0 refills | Status: DC

## 2023-07-02 NOTE — Addendum Note (Signed)
Addended by: Hendricks Milo on: 07/02/2023 04:53 PM   Modules accepted: Orders

## 2023-07-02 NOTE — Assessment & Plan Note (Signed)
She reports that since visit to the emergency department in November 2023, she has continued to have problems with more frequent stooling.  Reports that she will have sudden urges of needing to have a bowel movement and she will have increased urgency with some difficulty in holding bowel movement until able to make it to the bathroom.  She denies any significant issues with bowel movements in the past prior to this occurring.  She has not had any associated abdominal pain. Will refer patient to gastroenterology for further evaluation

## 2023-07-02 NOTE — Patient Instructions (Signed)
  Medication Instructions:  Your physician recommends that you continue on your current medications as directed. Please refer to the Current Medication list given to you today. --If you need a refill on any your medications before your next appointment, please call your pharmacy first. If no refills are authorized on file call the office.-- Lab Work: Your physician has recommended that you have lab work today: No If you have labs (blood work) drawn today and your tests are completely normal, you will receive your results via MyChart message OR a phone call from our staff.  Please ensure you check your voicemail in the event that you authorized detailed messages to be left on a delegated number. If you have any lab test that is abnormal or we need to change your treatment, we will call you to review the results.  Referrals/Procedures/Imaging: No  Follow-Up: Your next appointment:   Your physician recommends that you schedule a follow-up appointment in: 1 month with Dr. de [place].  You will receive a text message or e-mail with a link to a survey about your care and experience with us today! We would greatly appreciate your feedback!   Thanks for letting us be apart of your health journey!!  Primary Care and Sports Medicine   Dr. Raymond de Cuba   We encourage you to activate your patient portal called "MyChart".  Sign up information is provided on this After Visit Summary.  MyChart is used to connect with patients for Virtual Visits (Telemedicine).  Patients are able to view lab/test results, encounter notes, upcoming appointments, etc.  Non-urgent messages can be sent to your provider as well. To learn more about what you can do with MyChart, please visit --  [email or web address].    

## 2023-07-02 NOTE — Assessment & Plan Note (Signed)
Patient has also been having some difficulty with controlling urine.  Reports that she will have sudden urgency and may also note some leakage at time and has found herself needing to wear pads or similar products due to the frequency of leakage.  This has become more concerning for her and she is interested in proceeding with further evaluation We will refer patient to neurology for further evaluation and recommendations

## 2023-07-02 NOTE — Progress Notes (Signed)
Subjective:    CC: Annual Physical Exam  HPI:  Mary Yang is a 42 y.o. presenting for annual physical  I reviewed the past medical history, family history, social history, surgical history, and allergies today and no changes were needed.  Please see the problem list section below in epic for further details.  Past Medical History: Past Medical History:  Diagnosis Date   Asthma    Dizziness    Gestational hypertension    Hypercholesteremia    Lightheadedness    Near syncope    Palpitations    Vitamin D deficiency    Past Surgical History: Past Surgical History:  Procedure Laterality Date   arm surgery Right    WISDOM TOOTH EXTRACTION Bilateral    Social History: Social History   Socioeconomic History   Marital status: Married    Spouse name: Not on file   Number of children: Not on file   Years of education: Not on file   Highest education level: Not on file  Occupational History   Not on file  Tobacco Use   Smoking status: Never   Smokeless tobacco: Never  Vaping Use   Vaping status: Never Used  Substance and Sexual Activity   Alcohol use: Not Currently    Comment: occas   Drug use: Never   Sexual activity: Yes    Birth control/protection: Condom  Other Topics Concern   Not on file  Social History Narrative   Not on file   Social Determinants of Health   Financial Resource Strain: Not on file  Food Insecurity: Not on file  Transportation Needs: Not on file  Physical Activity: Not on file  Stress: Not on file  Social Connections: Not on file   Family History: Family History  Problem Relation Age of Onset   Hypertension Mother    Hyperlipidemia Mother    Diabetes Mother    Hypertension Father    Hypertension Brother    Hypertension Maternal Grandmother    Stroke Maternal Grandmother    Hypertension Maternal Grandfather    Hypertension Paternal Grandmother    Hypertension Paternal Grandfather    Allergies: Allergies  Allergen Reactions    Penicillins Hives   Medications: See med rec.  Review of Systems: No headache, visual changes, nausea, vomiting, diarrhea, constipation, dizziness, abdominal pain, skin rash, fevers, chills, night sweats, swollen lymph nodes, weight loss, chest pain, body aches, joint swelling, muscle aches, shortness of breath, mood changes, visual or auditory hallucinations.  Objective:    BP (!) 179/98 (BP Location: Right Arm, Mary Yang Position: Sitting, Cuff Size: Normal)   Pulse 86   Temp 97.7 F (36.5 C)   Ht 5\' 3"  (1.6 m)   Wt 177 lb 6.4 oz (80.5 kg)   SpO2 100%   BMI 31.42 kg/m   General: Well Developed, well nourished, and in no acute distress. Neuro: Alert and oriented x3, extra-ocular muscles intact, sensation grossly intact. Cranial nerves II through XII are intact, motor, sensory, and coordinative functions are all intact. HEENT: Normocephalic, atraumatic, pupils equal round reactive to light, neck supple, no masses, no lymphadenopathy, thyroid nonpalpable. Oropharynx, nasopharynx, external ear canals are unremarkable. Skin: Warm and dry, no rashes noted. Cardiac: Regular rate and rhythm, no murmurs rubs or gallops. Respiratory: Clear to auscultation bilaterally. Not using accessory muscles, speaking in full sentences. Abdominal: Soft, nontender, nondistended, positive bowel sounds, no masses, no organomegaly. Musculoskeletal: Shoulder, elbow, wrist, hip, knee, ankle stable, and with full range of motion.  Impression and Recommendations:  Wellness examination Assessment & Plan: Routine HCM labs reviewed. HCM reviewed/discussed. Anticipatory guidance regarding healthy weight, lifestyle and choices given. Recommend healthy diet.  Recommend approximately 150 minutes/week of moderate intensity exercise Recommend regular dental and vision exams Always use seatbelt/lap and shoulder restraints Recommend using smoke alarms and checking batteries at least twice a year Recommend using  sunscreen when outside Discussed tetanus immunization recommendations, Mary Yang agreed to proceed with this today   Need for tetanus booster  Frequent stools Assessment & Plan: Mary Yang reports that since visit to the emergency department in November 2023, Mary Yang has continued to have problems with more frequent stooling.  Reports that Mary Yang will have sudden urges of needing to have a bowel movement and Mary Yang will have increased urgency with some difficulty in holding bowel movement until able to make it to the bathroom.  Mary Yang denies any significant issues with bowel movements in the past prior to this occurring.  Mary Yang has not had any associated abdominal pain. Will refer Mary Yang to gastroenterology for further evaluation  Orders: -     Ambulatory referral to Gastroenterology  Urinary incontinence, unspecified type Assessment & Plan: Mary Yang has also been having some difficulty with controlling urine.  Reports that Mary Yang will have sudden urgency and may also note some leakage at time and has found herself needing to wear pads or similar products due to the frequency of leakage.  This has become more concerning for her and Mary Yang is interested in proceeding with further evaluation We will refer Mary Yang to neurology for further evaluation and recommendations  Orders: -     Ambulatory referral to Urology  Other orders -     Ondansetron HCl; Take 1 tablet (4 mg total) by mouth every 6 (six) hours.  Dispense: 20 tablet; Refill: 0 -     Albuterol Sulfate HFA; Inhale 1 puff into the lungs every 6 (six) hours as needed for wheezing or shortness of breath.  Dispense: 18 g; Refill: 1  Blood pressure elevated in office today, reports that Mary Yang has not been eating as well as Mary Yang typically was due to recent move and needing to be out more.  Additionally, Mary Yang has been variable in use of her blood pressure medication. Still elevated on recheck today.  We will plan for follow-up in about a month to monitor progress with blood  pressure and determine if any changes to pharmacotherapy are needed  Return in about 4 weeks (around 07/30/2023) for hypertension.   ___________________________________________ Arleene Settle de Peru, MD, ABFM, CAQSM Primary Care and Sports Medicine [facility]

## 2023-07-02 NOTE — Assessment & Plan Note (Signed)
Routine HCM labs reviewed. HCM reviewed/discussed. Anticipatory guidance regarding healthy weight, lifestyle and choices given. Recommend healthy diet.  Recommend approximately 150 minutes/week of moderate intensity exercise Recommend regular dental and vision exams Always use seatbelt/lap and shoulder restraints Recommend using smoke alarms and checking batteries at least twice a year Recommend using sunscreen when outside Discussed tetanus immunization recommendations, patient agreed to proceed with this today 

## 2023-07-23 ENCOUNTER — Encounter (HOSPITAL_BASED_OUTPATIENT_CLINIC_OR_DEPARTMENT_OTHER): Payer: Self-pay | Admitting: Family Medicine

## 2023-07-23 ENCOUNTER — Ambulatory Visit (INDEPENDENT_AMBULATORY_CARE_PROVIDER_SITE_OTHER): Payer: BC Managed Care – PPO | Admitting: Family Medicine

## 2023-07-23 DIAGNOSIS — F419 Anxiety disorder, unspecified: Secondary | ICD-10-CM | POA: Diagnosis not present

## 2023-07-23 DIAGNOSIS — F32A Depression, unspecified: Secondary | ICD-10-CM | POA: Diagnosis not present

## 2023-07-23 DIAGNOSIS — I10 Essential (primary) hypertension: Secondary | ICD-10-CM

## 2023-07-23 MED ORDER — LISINOPRIL 10 MG PO TABS: 10.0000 mg | ORAL_TABLET | Freq: Every day | ORAL | Status: DC

## 2023-07-23 NOTE — Assessment & Plan Note (Signed)
Has had an increase in symptoms over the past couple months.  Feels that this has corresponded to an increase in appetite.  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  She continues with sertraline at 50 mg dose. We discussed options today including counseling/therapy as well as adjusting the dose of medication.  She feels that current issues affecting symptoms are things which are not going to be addressed with counseling or with medication changes, as such.  She is not wishing to make any changes at this time.  For now, can continue with current management, if she wants to consider adjusting medication or proceeding with referral to a counselor or therapist, recommend that she reach out to the office and we can assist with this

## 2023-07-23 NOTE — Assessment & Plan Note (Signed)
Blood pressure continues to be elevated in office, did improve some on recheck.  She has not been checking blood pressure at home, has close, however these are in varying states of disrepair.  She has not had any issues with chest pain or headaches.  She does note that she does have dietary indiscretions, has had notable weight gain over the past year which she feels is also playing a role.  She also cites life stressors which have been worsening underlying symptoms related to anxiety/depression.  This has been most notable over the last 2 months.  She continues with medications as prescribed including lisinopril and amlodipine.  No side effects reported. We discussed options today, given current medication regimen and blood pressure elevation, recommend proceeding with adjustment in medications today.  After discussion of options, we will increase lisinopril to 10 mg, continue with amlodipine at same dose. She will return to office in 2 weeks for labs Will plan for follow-up in about 6 weeks to monitor response to adjusting blood pressure medication.  She will also continue to work on lifestyle modifications, DASH diet.  She plans to obtain new blood pressure cuff to monitor blood pressure at home If blood pressure continues to be elevated, depending on degree of elevation, may allow to continue with current regimen and lifestyle modifications or consider further adjusting pharmacotherapy, likely considering further adjustment in dose of lisinopril

## 2023-07-23 NOTE — Progress Notes (Signed)
  Medication Instructions:  Your physician recommends that you continue on your current medications as directed. Please refer to the Current Medication list given to you today. --If you need a refill on any your medications before your next appointment, please call your pharmacy first. If no refills are authorized on file call the office.-- Lab Work: Your physician has recommended that you have lab work today: in 2 weeks If you have labs (blood work) drawn today and your tests are completely normal, you will receive your results via MyChart message OR a phone call from our staff.  Please ensure you check your voicemail in the event that you authorized detailed messages to be left on a delegated number. If you have any lab test that is abnormal or we need to change your treatment, we will call you to review the results.    Follow-Up: Your next appointment:   Your physician recommends that you schedule a follow-up appointment in: 6 weeks with Dr. de Peru  You will receive a text message or e-mail with a link to a survey about your care and experience with [place] today! We would greatly appreciate your feedback!   Thanks for letting us be apart of your health journey!!  Primary Care and Sports Medicine   Dr. Ceasar Mons Peru   We encourage you to activate your patient portal called "MyChart".  Sign up information is provided on this After Visit Summary.  MyChart is used to connect with patients for Virtual Visits (Telemedicine).  Patients are able to view lab/test results, encounter notes, upcoming appointments, etc.  Non-urgent messages can be sent to your provider as well. To learn more about what you can do with MyChart, please visit --  [email or web address].

## 2023-07-23 NOTE — Progress Notes (Signed)
    Procedures performed today:    None.  Independent interpretation of notes and tests performed by another provider:   None.  Brief History, Exam, Impression, and Recommendations:    BP (!) 146/104   Pulse 74   Temp 98.8 F (37.1 C) (Oral)   Ht 5\' 3"  (1.6 m)   Wt 178 lb 6.4 oz (80.9 kg)   LMP 06/29/2023 (Exact Date)   SpO2 98%   BMI 31.60 kg/m   Primary hypertension Assessment & Plan: Blood pressure continues to be elevated in office, did improve some on recheck.  She has not been checking blood pressure at home, has close, however these are in varying states of disrepair.  She has not had any issues with chest pain or headaches.  She does note that she does have dietary indiscretions, has had notable weight gain over the past year which she feels is also playing a role.  She also cites life stressors which have been worsening underlying symptoms related to anxiety/depression.  This has been most notable over the last 2 months.  She continues with medications as prescribed including lisinopril and amlodipine.  No side effects reported. We discussed options today, given current medication regimen and blood pressure elevation, recommend proceeding with adjustment in medications today.  After discussion of options, we will increase lisinopril to 10 mg, continue with amlodipine at same dose. She will return to office in 2 weeks for labs Will plan for follow-up in about 6 weeks to monitor response to adjusting blood pressure medication.  She will also continue to work on lifestyle modifications, DASH diet.  She plans to obtain new blood pressure cuff to monitor blood pressure at home If blood pressure continues to be elevated, depending on degree of elevation, may allow to continue with current regimen and lifestyle modifications or consider further adjusting pharmacotherapy, likely considering further adjustment in dose of lisinopril  Orders: -     Lisinopril; Take 1 tablet (10 mg  total) by mouth daily. -     Basic metabolic panel; Future  Anxiety and depression Assessment & Plan: Has had an increase in symptoms over the past couple months.  Feels that this has corresponded to an increase in appetite.  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  She continues with sertraline at 50 mg dose. We discussed options today including counseling/therapy as well as adjusting the dose of medication.  She feels that current issues affecting symptoms are things which are not going to be addressed with counseling or with medication changes, as such.  She is not wishing to make any changes at this time.  For now, can continue with current management, if she wants to consider adjusting medication or proceeding with referral to a counselor or therapist, recommend that she reach out to the office and we can assist with this   Return in about 6 weeks (around 09/03/2023) for hypertension.   ___________________________________________ Zoe Nordin de [place], MD, ABFM, CAQSM Primary Care and Sports Medicine [facility]

## 2023-08-04 ENCOUNTER — Other Ambulatory Visit (HOSPITAL_BASED_OUTPATIENT_CLINIC_OR_DEPARTMENT_OTHER): Payer: Self-pay | Admitting: Family Medicine

## 2023-08-04 DIAGNOSIS — F419 Anxiety disorder, unspecified: Secondary | ICD-10-CM

## 2023-08-07 ENCOUNTER — Other Ambulatory Visit (HOSPITAL_BASED_OUTPATIENT_CLINIC_OR_DEPARTMENT_OTHER): Payer: Self-pay | Admitting: Family Medicine

## 2023-08-07 DIAGNOSIS — I10 Essential (primary) hypertension: Secondary | ICD-10-CM

## 2023-08-15 ENCOUNTER — Other Ambulatory Visit (HOSPITAL_BASED_OUTPATIENT_CLINIC_OR_DEPARTMENT_OTHER): Payer: Self-pay | Admitting: Family Medicine

## 2023-08-15 DIAGNOSIS — I10 Essential (primary) hypertension: Secondary | ICD-10-CM

## 2023-08-24 ENCOUNTER — Encounter (HOSPITAL_BASED_OUTPATIENT_CLINIC_OR_DEPARTMENT_OTHER): Payer: Self-pay | Admitting: Family Medicine

## 2023-09-03 ENCOUNTER — Other Ambulatory Visit (HOSPITAL_BASED_OUTPATIENT_CLINIC_OR_DEPARTMENT_OTHER): Payer: Self-pay | Admitting: Family Medicine

## 2023-09-03 ENCOUNTER — Encounter (HOSPITAL_BASED_OUTPATIENT_CLINIC_OR_DEPARTMENT_OTHER): Payer: Self-pay | Admitting: Family Medicine

## 2023-09-03 ENCOUNTER — Ambulatory Visit (INDEPENDENT_AMBULATORY_CARE_PROVIDER_SITE_OTHER): Payer: BC Managed Care – PPO | Admitting: Family Medicine

## 2023-09-03 VITALS — BP 158/102 | HR 76 | Ht 63.0 in | Wt 177.2 lb

## 2023-09-03 DIAGNOSIS — I10 Essential (primary) hypertension: Secondary | ICD-10-CM

## 2023-09-03 NOTE — Progress Notes (Signed)
    Procedures performed today:    None.  Independent interpretation of notes and tests performed by another provider:   None.  Brief History, Exam, Impression, and Recommendations:    BP (!) 162/102 (BP Location: Right Arm, Patient Position: Sitting, Cuff Size: Normal)   Pulse 76   Ht 5\' 3"  (1.6 m)   Wt 177 lb 3.2 oz (80.4 kg)   SpO2 100%   BMI 31.39 kg/m   Primary hypertension Assessment & Plan: At last visit, we slightly increased dose of lisinopril.  Patient has been doing well with this change.  Unfortunately, she did not return to have labs drawn following this medication change.  We will proceed with recheck of labs today.  She denies any issues with chest pain, headaches, lightheadedness or dizziness.  She unfortunately has not been checking her blood pressure at home. In the office today, blood pressure is above goal. Discussed options today.  We will continue with current medication regimen, no changes made today.  Patient will monitor for blood pressure intermittently at home and we will send message with blood pressure readings in about 3 to 4 weeks.  If home readings continue to be elevated, we will increase dose of lisinopril to 20 mg.  If home readings are better controlled, we will continue with regimen without any changes. Plan for follow-up in the office in about 6 to 8 weeks   Patient will also continue to work on lifestyle modifications, working towards gradual weight loss as she notes that weight has had a strong correlation with her blood pressure  Return in about 6 weeks (around 10/15/2023) for hypertension, blood pressure.   ___________________________________________ Isaiah Torok de [place], MD, ABFM, [facility] Primary Care and Sports Medicine [facility]

## 2023-09-03 NOTE — Assessment & Plan Note (Signed)
At last visit, we slightly increased dose of lisinopril.  Patient has been doing well with this change.  Unfortunately, she did not return to have labs drawn following this medication change.  We will proceed with recheck of labs today.  She denies any issues with chest pain, headaches, lightheadedness or dizziness.  She unfortunately has not been checking her blood pressure at home. In the office today, blood pressure is above goal. Discussed options today.  We will continue with current medication regimen, no changes made today.  Patient will monitor for blood pressure intermittently at home and we will send message with blood pressure readings in about 3 to 4 weeks.  If home readings continue to be elevated, we will increase dose of lisinopril to 20 mg.  If home readings are better controlled, we will continue with regimen without any changes. Plan for follow-up in the office in about 6 to 8 weeks

## 2023-09-04 LAB — BASIC METABOLIC PANEL
BUN/Creatinine Ratio: 18 (ref 9–23)
BUN: 11 mg/dL (ref 6–24)
CO2: 23 mmol/L (ref 20–29)
Calcium: 9.6 mg/dL (ref 8.7–10.2)
Chloride: 103 mmol/L (ref 96–106)
Creatinine, Ser: 0.61 mg/dL (ref 0.57–1.00)
Glucose: 83 mg/dL (ref 70–99)
Potassium: 4.5 mmol/L (ref 3.5–5.2)
Sodium: 140 mmol/L (ref 134–144)
eGFR: 113 mL/min/{1.73_m2} (ref >59)

## 2023-09-14 ENCOUNTER — Other Ambulatory Visit (HOSPITAL_BASED_OUTPATIENT_CLINIC_OR_DEPARTMENT_OTHER): Payer: Self-pay | Admitting: Family Medicine

## 2023-09-27 ENCOUNTER — Other Ambulatory Visit (HOSPITAL_BASED_OUTPATIENT_CLINIC_OR_DEPARTMENT_OTHER): Payer: Self-pay | Admitting: Family Medicine

## 2023-09-27 DIAGNOSIS — I10 Essential (primary) hypertension: Secondary | ICD-10-CM

## 2023-09-28 MED ORDER — LISINOPRIL 10 MG PO TABS: 10.0000 mg | ORAL_TABLET | Freq: Every day | ORAL | Status: DC

## 2023-10-08 ENCOUNTER — Other Ambulatory Visit (HOSPITAL_BASED_OUTPATIENT_CLINIC_OR_DEPARTMENT_OTHER): Payer: Self-pay

## 2023-10-08 ENCOUNTER — Other Ambulatory Visit (HOSPITAL_BASED_OUTPATIENT_CLINIC_OR_DEPARTMENT_OTHER): Payer: Self-pay | Admitting: Family Medicine

## 2023-10-08 DIAGNOSIS — I10 Essential (primary) hypertension: Secondary | ICD-10-CM

## 2023-10-08 MED ORDER — LISINOPRIL 10 MG PO TABS
10.0000 mg | ORAL_TABLET | Freq: Every day | ORAL | 1 refills | Status: DC
  Filled 2023-10-08: qty 30, 30d supply, fill #0

## 2023-10-23 ENCOUNTER — Ambulatory Visit (HOSPITAL_BASED_OUTPATIENT_CLINIC_OR_DEPARTMENT_OTHER): Payer: BC Managed Care – PPO | Admitting: Family Medicine

## 2023-11-09 ENCOUNTER — Ambulatory Visit (INDEPENDENT_AMBULATORY_CARE_PROVIDER_SITE_OTHER): Payer: BC Managed Care – PPO | Admitting: Family Medicine

## 2023-11-09 ENCOUNTER — Encounter (HOSPITAL_BASED_OUTPATIENT_CLINIC_OR_DEPARTMENT_OTHER): Payer: Self-pay | Admitting: Family Medicine

## 2023-11-09 VITALS — BP 150/101 | HR 97 | Temp 99.0°F | Ht 64.0 in | Wt 178.1 lb

## 2023-11-09 DIAGNOSIS — I10 Essential (primary) hypertension: Secondary | ICD-10-CM | POA: Diagnosis not present

## 2023-11-09 DIAGNOSIS — Z124 Encounter for screening for malignant neoplasm of cervix: Secondary | ICD-10-CM | POA: Diagnosis not present

## 2023-11-09 DIAGNOSIS — Z7689 Persons encountering health services in other specified circumstances: Secondary | ICD-10-CM

## 2023-11-09 MED ORDER — LISINOPRIL 10 MG PO TABS: 20.0000 mg | ORAL_TABLET | Freq: Every day | ORAL | 1 refills | Status: DC

## 2023-11-09 NOTE — Assessment & Plan Note (Signed)
Blood pressure is above goal in office today.  Patient does continue with medications as prescribed.  Has not been checking blood pressure as often as she typically would, however indicates that when she does check her blood pressure, readings continue to be elevated, similar to office today.  She denies any other symptoms such as chest pain, headaches, vision changes.  Options today, patient would be amenable to just take medication dose, we will look to increase dose of lisinopril to 20 mg and continue with amlodipine at 5 mg she will return to office in about 2 weeks for monitoring of labs Will plan for follow-up in about assess progress with medication change

## 2023-11-09 NOTE — Progress Notes (Signed)
    Procedures performed today:    None.  Independent interpretation of notes and tests performed by another provider:   None.  Brief History, Exam, Impression, and Recommendations:    BP (!) 150/101 (BP Location: Left Arm, Patient Position: Sitting, Cuff Size: Normal)   Pulse 97   Temp 99 F (37.2 C) (Oral)   Ht 5\' 4"  (1.626 m)   Wt 178 lb 1.6 oz (80.8 kg)   LMP 10/24/2023   SpO2 99%   BMI 30.57 kg/m   Encounter to establish care -     Ambulatory referral to Obstetrics / Gynecology  Primary hypertension Assessment & Plan: Blood pressure is above goal in office today.  Patient does continue with medications as prescribed.  Has not been checking blood pressure as often as she typically would, however indicates that when she does check her blood pressure, readings continue to be elevated, similar to office today.  She denies any other symptoms such as chest pain, headaches, vision changes.  Options today, patient would be amenable to just take medication dose, we will look to increase dose of lisinopril to 20 mg and continue with amlodipine at 5 mg she will return to office in about 2 weeks for monitoring of labs Will plan for follow-up in about assess progress with medication change  Orders: -     Lisinopril; Take 2 tablets (20 mg total) by mouth daily.  Dispense: 180 tablet; Refill: 1 -     Basic metabolic panel; Future  Cervical cancer screening -     Ambulatory referral to Obstetrics / Gynecology  Patient requesting referral to OB/GYN today to establish.  He needs to complete Pap smear as well as mammogram, referral placed today  Return in about 4 weeks (around 12/07/2023) for hypertension.   ___________________________________________ Rudie Rikard de [place], MD, ABFM, CAQSM Primary Care and Sports Medicine [facility]

## 2023-11-11 ENCOUNTER — Other Ambulatory Visit (HOSPITAL_BASED_OUTPATIENT_CLINIC_OR_DEPARTMENT_OTHER): Payer: Self-pay | Admitting: Family Medicine

## 2023-11-11 DIAGNOSIS — F419 Anxiety disorder, unspecified: Secondary | ICD-10-CM

## 2023-11-22 ENCOUNTER — Other Ambulatory Visit (HOSPITAL_BASED_OUTPATIENT_CLINIC_OR_DEPARTMENT_OTHER): Payer: Self-pay | Admitting: Family Medicine

## 2023-11-22 ENCOUNTER — Telehealth (HOSPITAL_BASED_OUTPATIENT_CLINIC_OR_DEPARTMENT_OTHER): Payer: Self-pay

## 2023-11-22 NOTE — Telephone Encounter (Signed)
Attempted to call pt in regards to new patient appt for medical history and pre-charting for upcoming appt.

## 2023-11-23 ENCOUNTER — Other Ambulatory Visit (HOSPITAL_BASED_OUTPATIENT_CLINIC_OR_DEPARTMENT_OTHER): Payer: BC Managed Care – PPO

## 2023-11-26 ENCOUNTER — Ambulatory Visit (HOSPITAL_BASED_OUTPATIENT_CLINIC_OR_DEPARTMENT_OTHER): Payer: BC Managed Care – PPO | Admitting: Certified Nurse Midwife

## 2023-11-26 ENCOUNTER — Other Ambulatory Visit (HOSPITAL_COMMUNITY)
Admission: RE | Admit: 2023-11-26 | Discharge: 2023-11-26 | Disposition: A | Discharge status: Home / Self Care | Payer: BC Managed Care – PPO | Source: Ambulatory Visit | Attending: Certified Nurse Midwife | Admitting: Certified Nurse Midwife

## 2023-11-26 ENCOUNTER — Encounter (HOSPITAL_BASED_OUTPATIENT_CLINIC_OR_DEPARTMENT_OTHER): Payer: Self-pay | Admitting: Certified Nurse Midwife

## 2023-11-26 ENCOUNTER — Other Ambulatory Visit (HOSPITAL_BASED_OUTPATIENT_CLINIC_OR_DEPARTMENT_OTHER): Payer: Self-pay | Admitting: Family Medicine

## 2023-11-26 VITALS — BP 150/106 | HR 96 | Ht 64.0 in | Wt 175.4 lb

## 2023-11-26 DIAGNOSIS — N898 Other specified noninflammatory disorders of vagina: Secondary | ICD-10-CM | POA: Insufficient documentation

## 2023-11-26 DIAGNOSIS — Z01419 Encounter for gynecological examination (general) (routine) without abnormal findings: Secondary | ICD-10-CM | POA: Diagnosis not present

## 2023-11-26 DIAGNOSIS — Z124 Encounter for screening for malignant neoplasm of cervix: Secondary | ICD-10-CM

## 2023-11-26 DIAGNOSIS — I10 Essential (primary) hypertension: Secondary | ICD-10-CM | POA: Diagnosis not present

## 2023-11-27 LAB — BASIC METABOLIC PANEL
BUN/Creatinine Ratio: 22 (ref 9–23)
BUN: 17 mg/dL (ref 6–24)
CO2: 17 mmol/L — ABNORMAL LOW (ref 20–29)
Calcium: 9.6 mg/dL (ref 8.7–10.2)
Chloride: 103 mmol/L (ref 96–106)
Creatinine, Ser: 0.78 mg/dL (ref 0.57–1.00)
Glucose: 93 mg/dL (ref 70–99)
Potassium: 4.4 mmol/L (ref 3.5–5.2)
Sodium: 143 mmol/L (ref 134–144)
eGFR: 96 mL/min/{1.73_m2} (ref >59)

## 2023-11-27 LAB — CERVICOVAGINAL ANCILLARY ONLY
Bacterial Vaginitis (gardnerella): POSITIVE — AB
Candida Glabrata: NEGATIVE
Candida Vaginitis: NEGATIVE
Comment: NEGATIVE
Comment: NEGATIVE
Comment: NEGATIVE

## 2023-11-29 ENCOUNTER — Other Ambulatory Visit (HOSPITAL_BASED_OUTPATIENT_CLINIC_OR_DEPARTMENT_OTHER): Payer: Self-pay | Admitting: Certified Nurse Midwife

## 2023-11-29 LAB — CYTOLOGY - PAP
Comment: NEGATIVE
Diagnosis: NEGATIVE
High risk HPV: NEGATIVE

## 2023-11-29 MED ORDER — FLUCONAZOLE 150 MG PO TABS: 150.0000 mg | ORAL_TABLET | ORAL | 2 refills | Status: DC | PRN

## 2023-11-29 MED ORDER — METRONIDAZOLE 500 MG PO TABS: 500.0000 mg | ORAL_TABLET | Freq: Two times a day (BID) | ORAL | 3 refills | Status: DC

## 2023-11-30 ENCOUNTER — Encounter (HOSPITAL_BASED_OUTPATIENT_CLINIC_OR_DEPARTMENT_OTHER): Payer: Self-pay | Admitting: Certified Nurse Midwife

## 2023-11-30 NOTE — Progress Notes (Signed)
42 y.o. [id number] Married White or Caucasian female here for annual exam.    Patient's last menstrual period was 11/17/2023.          Sexually active: Yes.      The pregnancy intention screening data noted above was reviewed. Potential methods of contraception were discussed.  Exercising: Yes.     Smoker:  no  Health Maintenance: Pap:  Pap smear collected History of abnormal Pap:  no MMG:  ordered Colonoscopy:  discussed recommendation Colonoscopy at age 71 Screening Labs: 06/25/23   reports that she has never smoked. She has never been exposed to tobacco smoke. She has never used smokeless tobacco. She reports that she does not currently use alcohol. She reports that she does not use drugs.  Past Medical History:  Diagnosis Date   Asthma    Dizziness    Gestational hypertension    Hypercholesteremia    Lightheadedness    Near syncope    Palpitations    Vitamin D deficiency     Past Surgical History:  Procedure Laterality Date   arm surgery Right    WISDOM TOOTH EXTRACTION Bilateral     Current Outpatient Medications  Medication Sig Dispense Refill   albuterol (VENTOLIN HFA) 108 (90 Base) MCG/ACT inhaler INHALE 1 PUFF INTO THE LUNGS EVERY 6 HOURS AS NEEDED FOR WHEEZING OR SHORTNESS OF BREATH. 8.5 each 1   amLODipine (NORVASC) 5 MG tablet TAKE 1 TABLET (5 MG TOTAL) BY MOUTH DAILY. 90 tablet 1   fluconazole (DIFLUCAN) 150 MG tablet Take 1 tablet (150 mg total) by mouth every other day as needed. 2 tablet 2   lisinopril (ZESTRIL) 10 MG tablet Take 2 tablets (20 mg total) by mouth daily. 180 tablet 1   sertraline (ZOLOFT) 50 MG tablet TAKE 1 TABLET BY MOUTH EVERY DAY 90 tablet 0   metroNIDAZOLE (FLAGYL) 500 MG tablet Take 1 tablet (500 mg total) by mouth 2 (two) times daily. 14 tablet 3   No current facility-administered medications for this visit.    Family History  Problem Relation Age of Onset   Hypertension Mother    Hyperlipidemia Mother    Diabetes Mother     Hypertension Father    Hypertension Brother    Hypertension Maternal Grandmother    Stroke Maternal Grandmother    Hypertension Maternal Grandfather    Hypertension Paternal Grandmother    Hypertension Paternal Grandfather     ROS: Constitutional: negative Genitourinary:positive for occasional vag odor  Exam:   BP (!) 150/106 (BP Location: Right Arm, Patient Position: Sitting, Cuff Size: Normal)   Pulse 96   Ht 5\' 4"  (1.626 m)   Wt 175 lb 6.4 oz (79.6 kg)   LMP 11/17/2023   BMI 30.11 kg/m   Height: 5\' 4"  (162.6 cm)  General appearance: alert, cooperative and appears stated age Head: Normocephalic, without obvious abnormality, atraumatic Lungs: clear to auscultation bilaterally Breasts: normal appearance, no masses or tenderness, Inspection negative, No nipple retraction or dimpling, No nipple discharge or bleeding, Normal to palpation without dominant masses Heart: regular rate and rhythm Abdomen: soft, non-tender; bowel sounds normal; no masses,  no organomegaly Extremities: extremities normal, atraumatic, no cyanosis or edema Skin: Skin color, texture, turgor normal. No rashes or lesions Lymph nodes: Cervical, supraclavicular, and axillary nodes normal. No abnormal inguinal nodes palpated Neurologic: Grossly normal   Pelvic: External genitalia:  no lesions              Urethra:  normal appearing urethra with no  masses, tenderness or lesions              Bartholins and Skenes: normal                 Vagina: normal appearing vagina with normal color and no discharge, no lesions              Cervix: multiparous appearance              Pap taken: Yes.   Bimanual Exam:  Uterus:  normal size, contour, position, consistency, mobility, non-tender              Adnexa: no mass, fullness, tenderness               Rectovaginal: Confirms               Anus:  normal sphincter tone, no lesions  Chaperone, Hendricks Milo, CMA, was present for exam.  Assessment/Plan:   1. Encounter  for well woman exam with routine gynecological exam - Breast self awareness encouraged - Pt does not desire birth control  2. Cervical cancer screening (Primary) - Cytology - PAP( [place])  3. Vaginal odor - Cervicovaginal ancillary only( [place])   RTO one year for annual gyn exam and prn if issues arise. Letta Kocher

## 2023-12-13 ENCOUNTER — Encounter (HOSPITAL_BASED_OUTPATIENT_CLINIC_OR_DEPARTMENT_OTHER): Payer: Self-pay | Admitting: Family Medicine

## 2023-12-13 ENCOUNTER — Ambulatory Visit (INDEPENDENT_AMBULATORY_CARE_PROVIDER_SITE_OTHER): Payer: BC Managed Care – PPO | Admitting: Family Medicine

## 2023-12-13 VITALS — BP 128/86 | HR 97 | Ht 63.0 in | Wt 181.2 lb

## 2023-12-13 DIAGNOSIS — I10 Essential (primary) hypertension: Secondary | ICD-10-CM | POA: Diagnosis not present

## 2023-12-13 NOTE — Progress Notes (Signed)
    Procedures performed today:    None.  Independent interpretation of notes and tests performed by another provider:   None.  Brief History, Exam, Impression, and Recommendations:    BP 128/86 (BP Location: Right Arm, Patient Position: Sitting)   Pulse 97   Ht 5' 3 (1.6 m)   Wt 181 lb 3.2 oz (82.2 kg)   LMP 11/17/2023   SpO2 99%   BMI 32.10 kg/m   Primary hypertension Assessment & Plan: Blood pressure is borderline in office today, improved on recheck.  Patient does continue with medications as prescribed, at last appointment we did increase dose of lisinopril , she has not noticed any adverse issues with this change.  Has not been checking blood pressure as often as she typically would.  She denies any other symptoms such as chest pain, headaches, vision changes.  Given slight improvement from last office visit, we can continue with current medication regimen and allow for patient to continue to work on lifestyle modifications which she plans to be more focused on enacting in the coming weeks and months Will plan for follow-up in about 6 to 8 weeks to assess progress   Return in about 6 weeks (around 01/24/2024).   ___________________________________________ Salem Mastrogiovanni de [place], MD, ABFM, CAQSM Primary Care and Sports Medicine [facility]

## 2023-12-13 NOTE — Patient Instructions (Signed)
  Medication Instructions:  Your physician recommends that you continue on your current medications as directed. Please refer to the Current Medication list given to you today. --If you need a refill on any your medications before your next appointment, please call your pharmacy first. If no refills are authorized on file call the office.--   Follow-Up: Your next appointment:   Your physician recommends that you schedule a follow-up appointment in: 6 week follow up  with Dr. de Peru  You will receive a text message or e-mail with a link to a survey about your care and experience with [place] today! We would greatly appreciate your feedback!   Thanks for letting us be apart of your health journey!!  Primary Care and Sports Medicine   Dr. Ceasar Mons Peru   We encourage you to activate your patient portal called "MyChart".  Sign up information is provided on this After Visit Summary.  MyChart is used to connect with patients for Virtual Visits (Telemedicine).  Patients are able to view lab/test results, encounter notes, upcoming appointments, etc.  Non-urgent messages can be sent to your provider as well. To learn more about what you can do with MyChart, please visit --  [email or web address].

## 2023-12-13 NOTE — Assessment & Plan Note (Addendum)
 Blood pressure is borderline in office today, improved on recheck.  Patient does continue with medications as prescribed, at last appointment we did increase dose of lisinopril , she has not noticed any adverse issues with this change.  Has not been checking blood pressure as often as she typically would.  She denies any other symptoms such as chest pain, headaches, vision changes.  Given slight improvement from last office visit, we can continue with current medication regimen and allow for patient to continue to work on lifestyle modifications which she plans to be more focused on enacting in the coming weeks and months Will plan for follow-up in about 6 to 8 weeks to assess progress

## 2024-01-24 ENCOUNTER — Ambulatory Visit (HOSPITAL_BASED_OUTPATIENT_CLINIC_OR_DEPARTMENT_OTHER): Payer: BC Managed Care – PPO | Admitting: Family Medicine

## 2024-01-29 ENCOUNTER — Encounter (HOSPITAL_BASED_OUTPATIENT_CLINIC_OR_DEPARTMENT_OTHER): Payer: Self-pay | Admitting: Family Medicine

## 2024-01-29 ENCOUNTER — Ambulatory Visit (INDEPENDENT_AMBULATORY_CARE_PROVIDER_SITE_OTHER): Payer: BC Managed Care – PPO | Admitting: Family Medicine

## 2024-01-29 VITALS — BP 129/89 | HR 90 | Ht 63.5 in | Wt 180.6 lb

## 2024-01-29 DIAGNOSIS — I10 Essential (primary) hypertension: Secondary | ICD-10-CM

## 2024-01-29 NOTE — Patient Instructions (Signed)
  Medication Instructions:  Your physician recommends that you continue on your current medications as directed. Please refer to the Current Medication list given to you today. --If you need a refill on any your medications before your next appointment, please call your pharmacy first. If no refills are authorized on file call the office.--   Follow-Up: Your next appointment:   Your physician recommends that you schedule a follow-up appointment in: 2 month follow up  with Dr. de Peru  You will receive a text message or e-mail with a link to a survey about your care and experience with [place] today! We would greatly appreciate your feedback!   Thanks for letting us be apart of your health journey!!  Primary Care and Sports Medicine   Dr. Ceasar Mons [place]   We encourage you to activate your patient portal called "MyChart".  Sign up information is provided on this After Visit Summary.  MyChart is used to connect with patients for Virtual Visits (Telemedicine).  Patients are able to view lab/test results, encounter notes, upcoming appointments, etc.  Non-urgent messages can be sent to your provider as well. To learn more about what you can do with MyChart, please visit --  [email or web address].

## 2024-01-29 NOTE — Progress Notes (Signed)
    Procedures performed today:    None.  Independent interpretation of notes and tests performed by another provider:   None.  Brief History, Exam, Impression, and Recommendations:    BP (!) 148/99 (BP Location: Right Arm, Patient Position: Sitting, Cuff Size: Normal)   Pulse 90   Ht 5' 3.5" (1.613 m)   Wt 180 lb 9.6 oz (81.9 kg)   SpO2 97%   BMI 31.49 kg/m   Primary hypertension Assessment & Plan: Blood pressure is borderline in office today, improved on recheck.  Patient does continue with medications as prescribed, amlodipine and lisinopril, denies any issues with medication.  Has not been checking blood pressure as often as she typically would.  She denies any other symptoms such as chest pain, headaches, vision changes.   Discussed options today, we can continue with current medication regimen and allow for patient to continue to work on lifestyle modifications which she plans to be more focused on enacting in the coming weeks and months Will plan for follow-up in about 2 months to assess progress   Return in about 2 months (around 03/28/2024) for hypertension.   ___________________________________________ Brewster Wolters de [place], MD, ABFM, CAQSM Primary Care and Sports Medicine [facility]

## 2024-01-29 NOTE — Assessment & Plan Note (Addendum)
 Blood pressure is borderline in office today, improved on recheck.  Patient does continue with medications as prescribed, amlodipine and lisinopril, denies any issues with medication.  Has not been checking blood pressure as often as she typically would.  She denies any other symptoms such as chest pain, headaches, vision changes.   Discussed options today, we can continue with current medication regimen and allow for patient to continue to work on lifestyle modifications which she plans to be more focused on enacting in the coming weeks and months Will plan for follow-up in about 2 months to assess progress

## 2024-01-31 ENCOUNTER — Other Ambulatory Visit (HOSPITAL_BASED_OUTPATIENT_CLINIC_OR_DEPARTMENT_OTHER): Payer: Self-pay | Admitting: Family Medicine

## 2024-02-08 ENCOUNTER — Other Ambulatory Visit (HOSPITAL_BASED_OUTPATIENT_CLINIC_OR_DEPARTMENT_OTHER): Payer: Self-pay | Admitting: Family Medicine

## 2024-02-08 DIAGNOSIS — I10 Essential (primary) hypertension: Secondary | ICD-10-CM

## 2024-02-08 DIAGNOSIS — F32A Depression, unspecified: Secondary | ICD-10-CM

## 2024-03-31 ENCOUNTER — Ambulatory Visit (HOSPITAL_BASED_OUTPATIENT_CLINIC_OR_DEPARTMENT_OTHER): Payer: BC Managed Care – PPO | Admitting: Family Medicine

## 2024-05-06 ENCOUNTER — Other Ambulatory Visit (HOSPITAL_BASED_OUTPATIENT_CLINIC_OR_DEPARTMENT_OTHER): Payer: Self-pay | Admitting: Family Medicine

## 2024-05-06 DIAGNOSIS — F419 Anxiety disorder, unspecified: Secondary | ICD-10-CM

## 2024-05-06 DIAGNOSIS — I10 Essential (primary) hypertension: Secondary | ICD-10-CM

## 2024-08-07 ENCOUNTER — Other Ambulatory Visit (HOSPITAL_BASED_OUTPATIENT_CLINIC_OR_DEPARTMENT_OTHER): Payer: Self-pay | Admitting: Family Medicine

## 2024-08-07 DIAGNOSIS — I10 Essential (primary) hypertension: Secondary | ICD-10-CM

## 2024-08-07 DIAGNOSIS — F32A Depression, unspecified: Secondary | ICD-10-CM

## 2024-11-01 ENCOUNTER — Other Ambulatory Visit (HOSPITAL_BASED_OUTPATIENT_CLINIC_OR_DEPARTMENT_OTHER): Payer: Self-pay | Admitting: Family Medicine

## 2024-11-01 DIAGNOSIS — F32A Depression, unspecified: Secondary | ICD-10-CM

## 2024-11-03 ENCOUNTER — Other Ambulatory Visit (HOSPITAL_BASED_OUTPATIENT_CLINIC_OR_DEPARTMENT_OTHER): Payer: Self-pay | Admitting: Family Medicine

## 2024-11-03 DIAGNOSIS — I10 Essential (primary) hypertension: Secondary | ICD-10-CM

## 2024-11-30 ENCOUNTER — Other Ambulatory Visit (HOSPITAL_BASED_OUTPATIENT_CLINIC_OR_DEPARTMENT_OTHER): Payer: Self-pay | Admitting: Family Medicine

## 2024-11-30 DIAGNOSIS — I10 Essential (primary) hypertension: Secondary | ICD-10-CM

## 2024-12-31 ENCOUNTER — Ambulatory Visit (INDEPENDENT_AMBULATORY_CARE_PROVIDER_SITE_OTHER): Admitting: Family Medicine

## 2024-12-31 ENCOUNTER — Encounter (HOSPITAL_BASED_OUTPATIENT_CLINIC_OR_DEPARTMENT_OTHER): Payer: Self-pay | Admitting: Family Medicine

## 2024-12-31 VITALS — BP 100/51 | HR 97 | Temp 98.0°F | Resp 18 | Ht 63.5 in | Wt 188.0 lb

## 2024-12-31 DIAGNOSIS — Z1211 Encounter for screening for malignant neoplasm of colon: Secondary | ICD-10-CM

## 2024-12-31 DIAGNOSIS — N939 Abnormal uterine and vaginal bleeding, unspecified: Secondary | ICD-10-CM | POA: Diagnosis not present

## 2024-12-31 DIAGNOSIS — Z0000 Encounter for general adult medical examination without abnormal findings: Secondary | ICD-10-CM | POA: Diagnosis not present

## 2024-12-31 LAB — CBC WITH DIFFERENTIAL/PLATELET
Basophils Absolute: 0.1 10*3/uL (ref 0.0–0.2)
Basos: 1 %
EOS (ABSOLUTE): 0.3 10*3/uL (ref 0.0–0.4)
Eos: 3 %
Hematocrit: 36.1 % (ref 34.0–46.6)
Hemoglobin: 11.3 g/dL (ref 11.1–15.9)
Immature Grans (Abs): 0.1 10*3/uL (ref 0.0–0.1)
Immature Granulocytes: 1 %
Lymphocytes Absolute: 2.9 10*3/uL (ref 0.7–3.1)
Lymphs: 26 %
MCH: 26.8 pg (ref 26.6–33.0)
MCHC: 31.3 g/dL — ABNORMAL LOW (ref 31.5–35.7)
MCV: 86 fL (ref 79–97)
Monocytes Absolute: 0.6 10*3/uL (ref 0.1–0.9)
Monocytes: 6 %
Neutrophils Absolute: 7 10*3/uL (ref 1.4–7.0)
Neutrophils: 63 %
Platelets: 426 10*3/uL (ref 150–450)
RBC: 4.21 x10E6/uL (ref 3.77–5.28)
RDW: 14.2 % (ref 11.7–15.4)
WBC: 10.9 10*3/uL — ABNORMAL HIGH (ref 3.4–10.8)

## 2024-12-31 LAB — HEMOGLOBIN A1C
Est. average glucose Bld gHb Est-mCnc: 117 mg/dL
Hgb A1c MFr Bld: 5.7 % — ABNORMAL HIGH (ref 4.8–5.6)

## 2024-12-31 LAB — COMPREHENSIVE METABOLIC PANEL WITH GFR
ALT: 21 [IU]/L (ref 0–32)
AST: 20 [IU]/L (ref 0–40)
Albumin: 4.6 g/dL (ref 3.9–4.9)
Alkaline Phosphatase: 100 [IU]/L (ref 41–116)
BUN/Creatinine Ratio: 21 (ref 9–23)
BUN: 16 mg/dL (ref 6–24)
Bilirubin Total: 0.3 mg/dL (ref 0.0–1.2)
CO2: 22 mmol/L (ref 20–29)
Calcium: 9.5 mg/dL (ref 8.7–10.2)
Chloride: 103 mmol/L (ref 96–106)
Creatinine, Ser: 0.75 mg/dL (ref 0.57–1.00)
Globulin, Total: 3.1 g/dL (ref 1.5–4.5)
Glucose: 81 mg/dL (ref 70–99)
Potassium: 5.1 mmol/L (ref 3.5–5.2)
Sodium: 139 mmol/L (ref 134–144)
Total Protein: 7.7 g/dL (ref 6.0–8.5)
eGFR: 100 mL/min/{1.73_m2}

## 2024-12-31 LAB — TSH RFX ON ABNORMAL TO FREE T4: TSH: 1.1 u[IU]/mL (ref 0.450–4.500)

## 2024-12-31 NOTE — Assessment & Plan Note (Signed)
 Routine HCM labs ordered. HCM reviewed/discussed. Anticipatory guidance regarding healthy weight, lifestyle and choices given. Recommend healthy diet.  Recommend approximately 150 minutes/week of moderate intensity exercise Recommend regular dental and vision exams Always use seatbelt/lap and shoulder restraints Recommend using smoke alarms and checking batteries at least twice a year Recommend using sunscreen when outside Discussed colon cancer screening recommendations, options.  Patient prefers colonoscopy, referral placed Discussed immunization recommendations

## 2024-12-31 NOTE — Assessment & Plan Note (Signed)
 Patient has noted ongoing issues with vaginal bleeding over the last couple months.  She more recently has had some issues with lightheadedness, dizziness.  Blood pressure at home has been better controlled compared to blood pressures in the past.  Here in the office today, blood pressure is relatively low, physically for patient's prior readings in the office. We discussed considerations.  We can proceed with labs for annual physical today and we will primarily focus upon CBC as well to assess for any current anemia.  She is currently established with OB/GYN [place], recommend scheduling follow-up visit with them.  We will also forward labs once received in order to assist with scheduling patient evaluation [place]door

## 2024-12-31 NOTE — Progress Notes (Signed)
 " Subjective:    CC: Annual Physical Exam  HPI:  Mary Yang is a 44 y.o. presenting for annual physical  I reviewed the past medical history, family history, social history, surgical history, and allergies today and no changes were needed.  Please see the problem list section below in epic for further details.  Past Medical History: Past Medical History:  Diagnosis Date   Asthma    Dizziness    Gestational hypertension    Hypercholesteremia    Lightheadedness    Near syncope    Palpitations    Vitamin D  deficiency    Past Surgical History: Past Surgical History:  Procedure Laterality Date   arm surgery Right    WISDOM TOOTH EXTRACTION Bilateral    Social History: Social History   Socioeconomic History   Marital status: Married    Spouse name: Not on file   Number of children: 3   Years of education: Not on file   Highest education level: Not on file  Occupational History   Not on file  Tobacco Use   Smoking status: Never    Passive exposure: Never   Smokeless tobacco: Never  Vaping Use   Vaping status: Never Used  Substance and Sexual Activity   Alcohol use: Not Currently    Comment: occas   Drug use: Never   Sexual activity: Yes    Birth control/protection: Condom  Other Topics Concern   Not on file  Social History Narrative   Not on file   Social Drivers of Health   Tobacco Use: Low Risk (12/31/2024)   Patient History    Smoking Tobacco Use: Never    Smokeless Tobacco Use: Never    Passive Exposure: Never  Financial Resource Strain: Not on file  Food Insecurity: Not on file  Transportation Needs: Not on file  Physical Activity: Not on file  Stress: Not on file  Social Connections: Not on file  Depression (PHQ2-9): Low Risk (01/29/2024)   Depression (PHQ2-9)    PHQ-2 Score: 0  Recent Concern: Depression (PHQ2-9) - High Risk (12/13/2023)   Depression (PHQ2-9)    PHQ-2 Score: 11  Alcohol Screen: Not on file  Housing: Not on file  Utilities: Not  on file  Health Literacy: Not on file   Family History: Family History  Problem Relation Age of Onset   Hypertension Mother    Hyperlipidemia Mother    Diabetes Mother    Hypertension Father    Hypertension Brother    Hypertension Maternal Grandmother    Stroke Maternal Grandmother    Hypertension Maternal Grandfather    Hypertension Paternal Grandmother    Hypertension Paternal Grandfather    Allergies: Allergies[1] Medications: See med rec.  Review of Systems: No headache, visual changes, nausea, vomiting, diarrhea, constipation, dizziness, abdominal pain, skin rash, fevers, chills, night sweats, swollen lymph nodes, weight loss, chest pain, body aches, joint swelling, muscle aches, shortness of breath, mood changes, visual or auditory hallucinations.  Objective:    BP (!) 100/51 (BP Location: Left Arm, Patient Position: Sitting, Cuff Size: Normal)   Pulse 97   Temp 98 F (36.7 C) (Oral)   Resp 18   Ht 5' 3.5 (1.613 m)   Wt 188 lb (85.3 kg)   SpO2 100%   BMI 32.78 kg/m   General: Well Developed, well nourished, and in no acute distress. Neuro: Alert and oriented x3, extra-ocular muscles intact, sensation grossly intact. Cranial nerves II through XII are intact, motor, sensory, and coordinative functions are  all intact. HEENT: Normocephalic, atraumatic, pupils equal round reactive to light, neck supple, no masses, no lymphadenopathy, thyroid nonpalpable. Oropharynx, nasopharynx, external ear canals are unremarkable. Skin: Warm and dry, no rashes noted. Cardiac: Regular rate and rhythm, no murmurs rubs or gallops. Respiratory: Clear to auscultation bilaterally. Not using accessory muscles, speaking in full sentences. Abdominal: Soft, nontender, nondistended, positive bowel sounds, no masses, no organomegaly. Musculoskeletal: Shoulder, elbow, wrist, hip, knee, ankle stable, and with full range of motion.  Impression and Recommendations:    Wellness  examination Assessment & Plan: Routine HCM labs ordered. HCM reviewed/discussed. Anticipatory guidance regarding healthy weight, lifestyle and choices given. Recommend healthy diet.  Recommend approximately 150 minutes/week of moderate intensity exercise Recommend regular dental and vision exams Always use seatbelt/lap and shoulder restraints Recommend using smoke alarms and checking batteries at least twice a year Recommend using sunscreen when outside Discussed colon cancer screening recommendations, options.  Patient prefers colonoscopy, referral placed Discussed immunization recommendations  Orders: -     CBC with Differential/Platelet -     Comprehensive metabolic panel with GFR -     Hemoglobin A1c -     TSH Rfx on Abnormal to Free T4  Vaginal bleeding Assessment & Plan: Patient has noted ongoing issues with vaginal bleeding over the last couple months.  She more recently has had some issues with lightheadedness, dizziness.  Blood pressure at home has been better controlled compared to blood pressures in the past.  Here in the office today, blood pressure is relatively low, physically for patient's prior readings in the office. We discussed considerations.  We can proceed with labs for annual physical today and we will primarily focus upon CBC as well to assess for any current anemia.  She is currently established with OB/GYN [place], recommend scheduling follow-up visit with them.  We will also forward labs once received in order to assist with scheduling patient evaluation [place]-door   Colon cancer screening -     Ambulatory referral to Gastroenterology  Return in about 3 months (around 03/31/2025) for hypertension, med check.   ___________________________________________ Farhad Burleson de [place], MD, ABFM, CAQSM [facility]    [1]  Allergies Allergen Reactions   Penicillins Hives   "

## 2025-01-01 ENCOUNTER — Ambulatory Visit (HOSPITAL_BASED_OUTPATIENT_CLINIC_OR_DEPARTMENT_OTHER): Payer: Self-pay | Admitting: Family Medicine

## 2025-03-23 ENCOUNTER — Encounter (HOSPITAL_BASED_OUTPATIENT_CLINIC_OR_DEPARTMENT_OTHER): Admitting: Family Medicine

## 2025-03-31 ENCOUNTER — Ambulatory Visit (HOSPITAL_BASED_OUTPATIENT_CLINIC_OR_DEPARTMENT_OTHER): Admitting: Family Medicine
